# Patient Record
Sex: Male | Born: 1985 | Race: White | Hispanic: No | Marital: Single | Smoking: Former smoker
Health system: Southern US, Community
[De-identification: ages and names within clinical notes are randomized; demographics above are authoritative.]

## PROBLEM LIST (undated history)

## (undated) DIAGNOSIS — Z720 Tobacco use: Secondary | ICD-10-CM

## (undated) DIAGNOSIS — R55 Syncope and collapse: Secondary | ICD-10-CM

## (undated) DIAGNOSIS — E876 Hypokalemia: Secondary | ICD-10-CM

## (undated) DIAGNOSIS — F1290 Cannabis use, unspecified, uncomplicated: Secondary | ICD-10-CM

## (undated) HISTORY — DX: Hypokalemia: E87.6

## (undated) HISTORY — DX: Cannabis use, unspecified, uncomplicated: F12.90

## (undated) HISTORY — DX: Hypomagnesemia: E83.42

## (undated) HISTORY — DX: Syncope and collapse: R55

## (undated) HISTORY — DX: Tobacco use: Z72.0

---
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
## 2007-01-09 ENCOUNTER — Emergency Department: Payer: Self-pay | Admitting: Emergency Medicine

## 2007-01-10 ENCOUNTER — Emergency Department: Payer: Self-pay | Admitting: Emergency Medicine

## 2007-01-23 ENCOUNTER — Emergency Department: Payer: Self-pay | Admitting: Emergency Medicine

## 2011-07-23 ENCOUNTER — Emergency Department: Payer: Self-pay | Admitting: Emergency Medicine

## 2017-09-08 ENCOUNTER — Emergency Department: Payer: Medicaid Other

## 2017-09-08 ENCOUNTER — Emergency Department
Admission: EM | Admit: 2017-09-08 | Discharge: 2017-09-08 | Disposition: A | Discharge status: Home / Self Care | Payer: Medicaid Other | Attending: Emergency Medicine | Admitting: Emergency Medicine

## 2017-09-08 DIAGNOSIS — Y939 Activity, unspecified: Secondary | ICD-10-CM | POA: Insufficient documentation

## 2017-09-08 DIAGNOSIS — Y999 Unspecified external cause status: Secondary | ICD-10-CM | POA: Diagnosis not present

## 2017-09-08 DIAGNOSIS — F17200 Nicotine dependence, unspecified, uncomplicated: Secondary | ICD-10-CM | POA: Insufficient documentation

## 2017-09-08 DIAGNOSIS — S99922A Unspecified injury of left foot, initial encounter: Secondary | ICD-10-CM | POA: Diagnosis present

## 2017-09-08 DIAGNOSIS — W1789XA Other fall from one level to another, initial encounter: Secondary | ICD-10-CM | POA: Insufficient documentation

## 2017-09-08 DIAGNOSIS — Y929 Unspecified place or not applicable: Secondary | ICD-10-CM | POA: Diagnosis not present

## 2017-09-08 DIAGNOSIS — S8255XA Nondisplaced fracture of medial malleolus of left tibia, initial encounter for closed fracture: Secondary | ICD-10-CM | POA: Diagnosis not present

## 2017-09-08 MED ORDER — OXYCODONE-ACETAMINOPHEN 5-325 MG PO TABS: 1.0000 | ORAL_TABLET | Freq: Four times a day (QID) | ORAL | 0 refills | Status: AC | PRN

## 2017-09-08 MED ORDER — OXYCODONE-ACETAMINOPHEN 5-325 MG PO TABS
1.0000 | ORAL_TABLET | Freq: Once | ORAL | Status: AC
  Administered 2017-09-08: 1 via ORAL
  Filled 2017-09-08: qty 1

## 2017-09-08 NOTE — ED Notes (Signed)
He reports that he was working on top of a camper and he accidentally fell off - [id number] fall  Bilateral foot pain present

## 2017-09-08 NOTE — ED Triage Notes (Signed)
Pt reports was on top of camper and lost balance and fell, now c/o of pain in both feet.

## 2017-09-08 NOTE — ED Provider Notes (Signed)
[facility] Emergency Department Provider Note  ____________________________________________  Time seen: Approximately 7:23 PM  I have reviewed the triage vital signs and the nursing notes.   HISTORY  Chief Complaint Foot Pain    HPI Raymond Farmer is a 31 y.o. male presents to the emergency department with bilateral feet pain after falling from a camper.  Patient reports that camper height was approximately 10 feet.  Patient has a history of bilateral feet arthritis.  Patient is currently under the care of Dr. Alberteen Spindleline.  Patient did not hit his head or lose consciousness.  He denies weakness, radiculopathy or changes in sensation in the upper or lower extremities.  Patient has not been able to bear weight since the incident.  No skin compromise occurred.  No alleviating measures have been attempted prior to presenting to the emergency department.   History reviewed. No pertinent past medical history.  There are no active problems to display for this patient.   History reviewed. No pertinent surgical history.  Prior to Admission medications   Medication Sig Start Date End Date Taking? Authorizing Provider  oxyCODONE-acetaminophen (ROXICET) 5-325 MG tablet Take 1 tablet by mouth every 6 (six) hours as needed for up to 5 days for severe pain. 09/08/17 09/13/17  Orvil FeilWoods, Laronica Bhagat M, PA-C    Allergies Patient has no known allergies.  No family history on file.  Social History Social History   Tobacco Use  . Smoking status: Current Every Day Smoker  Substance Use Topics  . Alcohol use: No    Frequency: Never  . Drug use: No     Review of Systems  Constitutional: No fever/chills Eyes: No visual changes. No discharge ENT: No upper respiratory complaints. Cardiovascular: no chest pain. Respiratory: no cough. No SOB. Gastrointestinal: No abdominal pain.  No nausea, no vomiting.  No diarrhea.  No constipation. Musculoskeletal: Patient has bilateral feet  pain.  Skin: Negative for rash, abrasions, lacerations, ecchymosis. Neurological: Negative for headaches, focal weakness or numbness.   ____________________________________________   PHYSICAL EXAM:  VITAL SIGNS: ED Triage Vitals  Enc Vitals Group     BP 09/08/17 1539 117/69     Pulse Rate 09/08/17 1539 70     Resp 09/08/17 1539 18     Temp 09/08/17 1539 98.9 F (37.2 C)     Temp src --      SpO2 09/08/17 1539 99 %     Weight 09/08/17 1538 110 lb (49.9 kg)     Height 09/08/17 1538 5\' 4"  (1.626 m)     Head Circumference --      Peak Flow --      Pain Score 09/08/17 1646 7     Pain Loc --      Pain Edu? --      Excl. in GC? --      Constitutional: Alert and oriented. Well appearing and in no acute distress. Eyes: Conjunctivae are normal. PERRL. EOMI. Head: Atraumatic. Cardiovascular: Normal rate, regular rhythm. Normal S1 and S2.  Good peripheral circulation. Respiratory: Normal respiratory effort without tachypnea or retractions. Lungs CTAB. Good air entry to the bases with no decreased or absent breath sounds. Musculoskeletal: Clubfoot deformity visualized bilaterally.  Patient is able to perform limited range of motion of the feet bilaterally.  Patient has pain elicited with palpation over the left medial malleolus.  Palpable dorsalis pedis pulse bilaterally and symmetrically. Neurologic:  Normal speech and language. No gross focal neurologic deficits are appreciated.  Skin:  Skin  is warm, dry and intact. No rash noted. Psychiatric: Mood and affect are normal. Speech and behavior are normal. Patient exhibits appropriate insight and judgement.   ____________________________________________   LABS (all labs ordered are listed, but only abnormal results are displayed)  Labs Reviewed - No data to display ____________________________________________  EKG   ____________________________________________  RADIOLOGY Geraldo Pitter, personally viewed and evaluated  these images (plain radiographs) as part of my medical decision making, as well as reviewing the written report by the radiologist.  Ct Foot Left Wo Contrast  Result Date: 09/08/2017 CLINICAL DATA:  Left foot pain after fall off a roof. History of clubfoot repair. EXAM: CT OF THE LEFT FOOT WITHOUT CONTRAST TECHNIQUE: Multidetector CT imaging of the left foot was performed according to the standard protocol. Multiplanar CT image reconstructions were also generated. COMPARISON:  09/08/2017 and 01/09/2007 radiographs of the left foot. FINDINGS: Bones/Joint/Cartilage There are acute, closed fractures of the medial and posterior malleoli with tiny fracture fragment displaced posteriorly off the posterior malleolus similar to findings on same day radiographs. The medial malleolar fracture is nondisplaced in appearance. No joint dislocations. The subtalar joint is maintained without fracture. A curvilinear lucency is noted along the posterior third of the calcaneus surrounded by subtle sclerosis, image 35, series 605 without apparent cortical break. This appearance is more suggestive of a calcaneal stress fracture as the lucency crosses perpendicular to trabecular bone markings and given the presence of slight surrounding sclerosis. Mild dorsal widening across the talonavicular and naviculocuneiform articulations are chronic in consistent with prior clubfoot repair. Subcortical cystic change of the navicular bone is identified, degenerative in etiology. Pes cavus configuration of the midfoot. Ligaments Suboptimally assessed by CT. Muscles and Tendons No muscle atrophy or tendon entrapment Soft tissues No focal soft tissue hematoma. There is soft tissue swelling normal about the ankle joint more so medially. IMPRESSION: 1. Acute nondisplaced transverse fracture through the medial malleolus. 2. Acute posterior malleolar fracture with minimal displacement of a small sliver of bone posteriorly. 3. Subtle curvilinear  lucency crossing trabecular markings within the posterior calcaneus. There is slight sclerosis about the lucency which does not extend to the cortical surface. Findings or more in keeping with a pre-existing, concomitant calcaneal stress fracture. 4. Periarticular soft tissue swelling secondary to the fractures about the ankle. 5. Pes cavus deformity of the foot that may be secondary to prior clubfoot repair. 6. The tibiotalar and subtalar joints are maintained. Electronically Signed   By: Tollie Eth M.D.   On: 09/08/2017 18:45   Ct Foot Right Wo Contrast  Result Date: 09/08/2017 CLINICAL DATA:  Right heel pain after jumping off a roof. History of clubfoot repair. EXAM: CT OF THE RIGHT FOOT WITHOUT CONTRAST TECHNIQUE: Multidetector CT imaging of the right foot was performed according to the standard protocol. Multiplanar CT image reconstructions were also generated. COMPARISON:  Same day foot radiographs FINDINGS: Bones/Joint/Cartilage Pes cavus configuration of the midfoot. The bones are slightly demineralized in appearance. No acute displaced fracture. The tibiotalar and subtalar joints are congruent. Talonavicular osteoarthritis with subchondral cystic change of the navicular is noted. There is chondrocalcinosis of the cartilage within this joint, posterior subtalar facet and across the calcaneocuboid articulation. Subchondral degenerative cystic change of the lateral cuneiform is also noted. Mild dorsal widening across the talonavicular and naviculocuneiform articulations are likely secondary to chronic developmental or postop change for clubfoot repair. Lisfranc articulation appears intact. Ligaments Suboptimally assessed by CT. Muscles and Tendons Negative Soft tissues Subcutaneous soft tissue  induration along the plantar aspect of the hindfoot consistent with soft tissue contusion. No focal fluid collection to suggest hematoma IMPRESSION: 1. Pes cavus of the midfoot. 2. No acute foot fracture or joint  dislocation. 3. 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 4. Mild plantar soft tissue induration consistent with posttraumatic contusion. Electronically Signed   By: Tollie Eth M.D.   On: 09/08/2017 18:35   Dg Foot Complete Left  Result Date: 09/08/2017 CLINICAL DATA:  Left foot pain after jumping from roof and landing on feet. History of clubfoot repair. Posterior right heel pain. EXAM: LEFT FOOT - COMPLETE 3+ VIEW COMPARISON:  2008 foot radiographs FINDINGS: Minimally displaced coronal fracture fragment is seen posterior to the ankle joint suspicious for a posterior malleolar fracture. Incongruity of the posterior facet of the subtalar joint is also noted on the lateral view suspicious for a minimally displaced calcaneal fracture. Periarticular soft tissue swelling is noted about the ankle. There is chronic slight widening along the dorsal aspect of the naviculocuneiform and talonavicular joints with pes cavus configuration of the midfoot. Findings given chronicity likely represent post pop change. Reportedly, the patient has had prior foot surgeries for clubfoot deformity. IMPRESSION: 1. Incongruity of the posterior facet of the subtalar joint suspicious for a calcaneal fracture. 2. Small fracture fragment is seen posterior to the distal tibia suspicious for a posterior malleolar fracture. 3. There is pes cavus of the midfoot with slight dorsal widening across the talonavicular and naviculocuneiform articulations which appear chronic and stable. Reportedly the patient has had multiple prior surgeries for clubfoot deformity which this appearance may represent. Electronically Signed   By: Tollie Eth M.D.   On: 09/08/2017 16:24   Dg Foot Complete Right  Result Date: 09/08/2017 CLINICAL DATA:  Pain after jumping off roof and landing on feet. History of prior foot surgery for clubfoot repair. EXAM: RIGHT FOOT COMPLETE - 3+ VIEW COMPARISON:  Contralateral foot radiographs from the  same day. FINDINGS: Pes cavus deformity of the foot with dorsal widening of the talonavicular and naviculocuneiform articulations similar to the contralateral foot. Findings likely represent chronic changes post clubfoot surgery. No joint dislocation is identified across the tibiotalar nor subtalar joints. No acute appearing fracture is visualized. IMPRESSION: Chronic postop appearance the midfoot without acute displaced appearing fracture. If the patient has pain out of proportion to radiographic findings, CT may help for further correlation. Electronically Signed   By: Tollie Eth M.D.   On: 09/08/2017 16:30    ____________________________________________    PROCEDURES  Procedure(s) performed:    Procedures    Medications  oxyCODONE-acetaminophen (PERCOCET/ROXICET) 5-325 MG per tablet 1 tablet (1 tablet Oral Given 09/08/17 1917)     ____________________________________________   INITIAL IMPRESSION / ASSESSMENT AND PLAN / ED COURSE  Pertinent labs & imaging results that were available during my care of the patient were reviewed by me and considered in my medical decision making (see chart for details).  Review of the [place] CSRS was performed in accordance of the [facility] prior to dispensing any controlled drugs.    Assessment and plan Medial malleolus fracture, left.   Patient presents to the emergency department with bilateral feet pain after falling from a camper.  Initial x-rays conducted in the emergency department revealed a left medial malleolus fracture and possible calcaneus fracture.  Due to patient's history of feet osteoarthritis with associated deformity, CT was warranted bilaterally.  CT examination revealed a medial malleolus fracture and possible stress fracture of the  calcaneus that is likely chronic in nature.  Patient was splinted in the emergency department and advised to follow-up with podiatry, Dr. Alberteen Spindleline.  Vital signs were reassuring prior to discharge.  Patient was  discharged with Roxicet.  All patient questions were answered.     ____________________________________________  FINAL CLINICAL IMPRESSION(S) / ED DIAGNOSES  Final diagnoses:  Nondisplaced fracture of medial malleolus of left tibia, initial encounter for closed fracture      NEW MEDICATIONS STARTED DURING THIS VISIT:  ED Discharge Orders        Ordered    oxyCODONE-acetaminophen (ROXICET) 5-325 MG tablet  Every 6 hours PRN     09/08/17 1905          This chart was dictated using voice recognition software/Dragon. Despite best efforts to proofread, errors can occur which can change the meaning. Any change was purely unintentional.    Orvil FeilWoods, Punam Broussard M, PA-C 09/08/17 1934    Merrily Brittleifenbark, Neil, MD 09/08/17 2007

## 2017-11-17 ENCOUNTER — Encounter (HOSPITAL_COMMUNITY): Payer: Self-pay | Admitting: Emergency Medicine

## 2017-11-17 ENCOUNTER — Emergency Department (HOSPITAL_COMMUNITY)
Admission: EM | Admit: 2017-11-17 | Discharge: 2017-11-17 | Discharge status: Against Medical Advice | Payer: Medicaid Other | Attending: Emergency Medicine | Admitting: Emergency Medicine

## 2017-11-17 ENCOUNTER — Other Ambulatory Visit: Payer: Self-pay

## 2017-11-17 DIAGNOSIS — Z5321 Procedure and treatment not carried out due to patient leaving prior to being seen by health care provider: Secondary | ICD-10-CM | POA: Diagnosis not present

## 2017-11-17 DIAGNOSIS — R1031 Right lower quadrant pain: Secondary | ICD-10-CM | POA: Diagnosis not present

## 2017-11-17 LAB — BASIC METABOLIC PANEL
ANION GAP: 17 — AB (ref 5–15)
BUN: 16 mg/dL (ref 6–20)
CO2: 20 mmol/L — AB (ref 22–32)
Calcium: 9.7 mg/dL (ref 8.9–10.3)
Chloride: 103 mmol/L (ref 101–111)
Creatinine, Ser: 1.21 mg/dL (ref 0.61–1.24)
GFR calc Af Amer: >60 mL/min (ref >60)
GFR calc non Af Amer: >60 mL/min (ref >60)
GLUCOSE: 95 mg/dL (ref 65–99)
POTASSIUM: 3.9 mmol/L (ref 3.5–5.1)
Sodium: 140 mmol/L (ref 135–145)

## 2017-11-17 LAB — CBC
HEMATOCRIT: 44.6 % (ref 39.0–52.0)
HEMOGLOBIN: 15.3 g/dL (ref 13.0–17.0)
MCH: 30.1 pg (ref 26.0–34.0)
MCHC: 34.3 g/dL (ref 30.0–36.0)
MCV: 87.6 fL (ref 78.0–100.0)
Platelets: 202 10*3/uL (ref 150–400)
RBC: 5.09 MIL/uL (ref 4.22–5.81)
RDW: 13.7 % (ref 11.5–15.5)
WBC: 9.7 10*3/uL (ref 4.0–10.5)

## 2017-11-17 LAB — LIPASE, BLOOD: Lipase: 24 U/L (ref 11–51)

## 2017-11-17 NOTE — ED Notes (Signed)
Called patient for vitals recheck, no answer.

## 2017-11-17 NOTE — ED Notes (Signed)
No answer in waiting room 

## 2017-11-17 NOTE — ED Notes (Signed)
No answer for vitals recheck

## 2017-11-17 NOTE — ED Triage Notes (Signed)
Pt in via [facility] with c/o RLQ pain, n/v since yesterday. Per EMS, [facility]. EMS states pt had syncopal episode with unresponsiveness x 3 min. BP 108/74, CBG 122, given zofran 4mg  PTA

## 2017-11-18 ENCOUNTER — Other Ambulatory Visit: Payer: Self-pay

## 2017-11-18 ENCOUNTER — Emergency Department
Admission: EM | Admit: 2017-11-18 | Discharge: 2017-11-18 | Disposition: A | Discharge status: Home / Self Care | Payer: Medicaid Other | Attending: Emergency Medicine | Admitting: Emergency Medicine

## 2017-11-18 ENCOUNTER — Emergency Department: Payer: Medicaid Other

## 2017-11-18 ENCOUNTER — Encounter: Payer: Self-pay | Admitting: Emergency Medicine

## 2017-11-18 DIAGNOSIS — E876 Hypokalemia: Secondary | ICD-10-CM

## 2017-11-18 DIAGNOSIS — R531 Weakness: Secondary | ICD-10-CM | POA: Diagnosis not present

## 2017-11-18 DIAGNOSIS — R110 Nausea: Secondary | ICD-10-CM | POA: Diagnosis not present

## 2017-11-18 DIAGNOSIS — F17200 Nicotine dependence, unspecified, uncomplicated: Secondary | ICD-10-CM | POA: Diagnosis not present

## 2017-11-18 DIAGNOSIS — R4020 Unspecified coma: Secondary | ICD-10-CM

## 2017-11-18 DIAGNOSIS — R569 Unspecified convulsions: Secondary | ICD-10-CM | POA: Diagnosis present

## 2017-11-18 LAB — COMPREHENSIVE METABOLIC PANEL
ALT: 15 U/L — AB (ref 17–63)
AST: 22 U/L (ref 15–41)
Albumin: 3.4 g/dL — ABNORMAL LOW (ref 3.5–5.0)
Alkaline Phosphatase: 69 U/L (ref 38–126)
Anion gap: 7 (ref 5–15)
BILIRUBIN TOTAL: 0.4 mg/dL (ref 0.3–1.2)
BUN: 17 mg/dL (ref 6–20)
CHLORIDE: 112 mmol/L — AB (ref 101–111)
CO2: 21 mmol/L — ABNORMAL LOW (ref 22–32)
CREATININE: 0.89 mg/dL (ref 0.61–1.24)
Calcium: 6.9 mg/dL — ABNORMAL LOW (ref 8.9–10.3)
GFR calc non Af Amer: >60 mL/min (ref >60)
Glucose, Bld: 80 mg/dL (ref 65–99)
POTASSIUM: 2.7 mmol/L — AB (ref 3.5–5.1)
Sodium: 140 mmol/L (ref 135–145)
TOTAL PROTEIN: 5.7 g/dL — AB (ref 6.5–8.1)

## 2017-11-18 LAB — URINE DRUG SCREEN, QUALITATIVE (ARMC ONLY)
Amphetamines, Ur Screen: NOT DETECTED
BARBITURATES, UR SCREEN: NOT DETECTED
Benzodiazepine, Ur Scrn: NOT DETECTED
CANNABINOID 50 NG, UR ~~LOC~~: POSITIVE — AB
COCAINE METABOLITE, UR ~~LOC~~: NOT DETECTED
MDMA (Ecstasy)Ur Screen: NOT DETECTED
METHADONE SCREEN, URINE: NOT DETECTED
Opiate, Ur Screen: NOT DETECTED
Phencyclidine (PCP) Ur S: NOT DETECTED
Tricyclic, Ur Screen: NOT DETECTED

## 2017-11-18 LAB — CBC
HEMATOCRIT: 41 % (ref 40.0–52.0)
Hemoglobin: 13.6 g/dL (ref 13.0–18.0)
MCH: 29.2 pg (ref 26.0–34.0)
MCHC: 33.2 g/dL (ref 32.0–36.0)
MCV: 88.1 fL (ref 80.0–100.0)
PLATELETS: 185 10*3/uL (ref 150–440)
RBC: 4.65 MIL/uL (ref 4.40–5.90)
RDW: 13.4 % (ref 11.5–14.5)
WBC: 12.4 10*3/uL — ABNORMAL HIGH (ref 3.8–10.6)

## 2017-11-18 LAB — MAGNESIUM: MAGNESIUM: 1.4 mg/dL — AB (ref 1.7–2.4)

## 2017-11-18 MED ORDER — ONDANSETRON 4 MG PO TBDP: 4.0000 mg | ORAL_TABLET | Freq: Three times a day (TID) | ORAL | 0 refills | Status: DC | PRN

## 2017-11-18 MED ORDER — SODIUM CHLORIDE 0.9 % IV SOLN
1.0000 g | Freq: Once | INTRAVENOUS | Status: AC
  Administered 2017-11-18: 1 g via INTRAVENOUS
  Filled 2017-11-18: qty 10

## 2017-11-18 MED ORDER — SODIUM CHLORIDE 0.9 % IV SOLN
Freq: Once | INTRAVENOUS | Status: AC
  Administered 2017-11-18: 19:00:00 via INTRAVENOUS

## 2017-11-18 MED ORDER — POTASSIUM CHLORIDE 10 MEQ/100ML IV SOLN
10.0000 meq | Freq: Once | INTRAVENOUS | Status: AC
  Administered 2017-11-18: 10 meq via INTRAVENOUS
  Filled 2017-11-18: qty 100

## 2017-11-18 MED ORDER — POTASSIUM CHLORIDE CRYS ER 20 MEQ PO TBCR
40.0000 meq | EXTENDED_RELEASE_TABLET | Freq: Once | ORAL | Status: AC
  Administered 2017-11-18: 40 meq via ORAL
  Filled 2017-11-18: qty 2

## 2017-11-18 MED ORDER — MAGNESIUM SULFATE 2 GM/50ML IV SOLN
2.0000 g | Freq: Once | INTRAVENOUS | Status: AC
  Administered 2017-11-18: 2 g via INTRAVENOUS
  Filled 2017-11-18: qty 50

## 2017-11-18 MED ORDER — POTASSIUM CHLORIDE ER 20 MEQ PO TBCR: 20.0000 meq | EXTENDED_RELEASE_TABLET | Freq: Every day | ORAL | 0 refills | Status: DC

## 2017-11-18 MED ORDER — ONDANSETRON HCL 4 MG/2ML IJ SOLN
4.0000 mg | Freq: Once | INTRAMUSCULAR | Status: DC
  Filled 2017-11-18: qty 2

## 2017-11-18 MED ORDER — CALCIUM GLUCONATE 500 MG PO TABS: 1.0000 | ORAL_TABLET | Freq: Three times a day (TID) | ORAL | 0 refills | Status: DC

## 2017-11-18 NOTE — Discharge Instructions (Signed)
As I explained to you, at this time I am not sure if you are passing out or having seizures. Take zofran as needed for nausea. Drink 1 glass of orange juice and eat one banana a day until you are able to eat normally to avoid have low potassium again. It is important that you follow up with Neurology and cardiology for further evaluation. In the meantime DO NOT DRIVE or do any activities that could cause injury to yourself or others if you were to loose consciousness. If you have another episode, please return to the ER.   Seizures may happen at any time. It is important to take certain precautions to maintain your safety.   Follow up with your doctor in 1-3 days.  During a seizure, a person may injure himself or herself. Seizure precautions are guidelines that a person can follow in order to minimize injury during a seizure. For any activity, it is important to ask, "What would happen if I had a seizure while doing this?" Follow the below precautions.  Bathroom Safety  A person with seizures may want to shower instead of bathe to avoid accidental drowning. If falls occur during the patient's typical seizure, a person should use a shower seat, preferably one with a safety strap.  Use nonskid strips in your shower or tub.  Never use electrical equipment near water. This prevents accidental electrocution.  Consider changing glass in shower doors to shatterproof glass.  Secondary school teacherKitchen Safety If possible, cook when someone else is nearby.  Use the back burners of the stove to prevent accidental burns.  Use shatterproof containers as much as possible. For instance, sauces can be transferred from glass bottles to plastic containers for use.  Limit time that is required using knives or other sharp objects. If possible, buy foods that are already cut, or ask someone to help in meal preparation.   General Safety at Home Do not smoke or light fires in the fireplace unless someone else is present.  Do not use  space heaters that can be accidentally overturned.  When alone, avoid using step stools or ladders, and do not clean rooftop gutters.  Purchase power tools and motorized Risk managerlawn equipment which have a safety switch that will stop the machine if you release the handle (a 'dead man's' switch).   Driving and Transportation Avoid driving unless your seizures are well controlled and/or you have permission to drive from your state's Department of Motor Vehicles  [facility](DMV). Each state has different laws. Please refer to the following link on the [employer]ile[employer] of [employer]'s website for more information: http://www.epilepsyfoundation.org/answerplace/Social/driving/drivingu.cfm  If you ride a bicycle, wear a helmet and any other necessary protective gear.  When taking public transportation like the bus or subway, stay clear of the platform edge.   Outdoor Theatre managerand Sports Safety Swimming is okay, but does present certain risks. Never swim alone, and tell friends what to do if you have a seizure while swimming.  Wear appropriate protective equipment.  Ski with a friend. If a seizure occurs, your friend can seek help, if needed. He or she can also help to get you out of the cold. Consider using a safety hook or belt while riding the ski lift.

## 2017-11-18 NOTE — ED Provider Notes (Addendum)
[facility] Emergency Department Provider Note  ____________________________________________  Time seen: Approximately 4:46 PM  I have reviewed the triage vital signs and the nursing notes.   HISTORY  Chief Complaint Seizures   HPI Raymond Farmer is a 32 y.o. male with no significant past medical history who presents for evaluation of loss of consciousness. According to patient's wife he has had nausea and decreased appetite for 3 days. Yesterday he was feeling very weak and had an episode of LOC while sitting on the couch. As they were driving him to the emergency room patient had a second episode of LOC and they [place] [employer]. When patient regained consciousness he was combative and confused. [facility] was [facility] however due to the waiting time he left without being seen. Today he was sitting in the carport smoking a cigarette when his wife arrived from the supermarket. She put the groceries in the kitchen and when she walked out patient was  on the floor unconscious. She denies ever seeing any seizure-like activity and reports that patient becomes floppy. 911 was called. When the paramedics arrived patient was alert, combative, disoriented. No bowel or bladder loss, no tongue trauma, no generalized tonic-clonic activity. No prior history of seizure, no family history of seizure, syncope or sudden death. Patient is a smoker but denies alcohol use. He endorses that he smokes marijuana occasionally but no other drugs. Patient reports that he has no recollection of these episodes and has no warning. She denies headache, chest pain, palpitations, shortness of breath, abdominal pain, diarrhea, fever or chills.  History reviewed. No pertinent past medical history.  There are no active problems to display for this patient.   History reviewed. No pertinent surgical history.  Prior to Admission medications   Not on File    Allergies Patient has  no known allergies.  No family history on file.  Social History Social History   Tobacco Use  . Smoking status: Current Every Day Smoker  . Smokeless tobacco: Never Used  Substance Use Topics  . Alcohol use: No    Frequency: Never  . Drug use: Yes    Frequency: 2.0 times per week    Types: Marijuana    Review of Systems  Constitutional: Negative for fever. + LOC episodes Eyes: Negative for visual changes. ENT: Negative for sore throat. Neck: No neck pain  Cardiovascular: Negative for chest pain. Respiratory: Negative for shortness of breath. Gastrointestinal: Negative for abdominal pain, vomiting or diarrhea. Genitourinary: Negative for dysuria. Musculoskeletal: Negative for back pain. Skin: Negative for rash. Neurological: Negative for headaches, weakness or numbness. Psych: No SI or HI  ____________________________________________   PHYSICAL EXAM:  VITAL SIGNS: ED Triage Vitals  Enc Vitals Group     BP 11/18/17 1615 122/82     Pulse Rate 11/18/17 1615 (!) 51     Resp 11/18/17 1615 14     Temp 11/18/17 1615 98.4 F (36.9 C)     Temp Source 11/18/17 1615 Oral     SpO2 11/18/17 1551 99 %     Weight 11/18/17 1616 120 lb (54.4 kg)     Height 11/18/17 1616 5\' 2"  (1.575 m)     Head Circumference --      Peak Flow --      Pain Score --      Pain Loc --      Pain Edu? --      Excl. in GC? --  Constitutional: Alert and oriented. Well appearing and in no apparent distress. HEENT:      Head: Normocephalic and atraumatic.         Eyes: Conjunctivae are normal. Sclera is non-icteric.       Mouth/Throat: Mucous membranes are moist.       Neck: Supple with no signs of meningismus. Cardiovascular: Regular rate and rhythm. No murmurs, gallops, or rubs. 2+ symmetrical distal pulses are present in all extremities. No JVD. Respiratory: Normal respiratory effort. Lungs are clear to auscultation bilaterally. No wheezes, crackles, or rhonchi.  Gastrointestinal: Soft,  non tender, and non distended with positive bowel sounds. No rebound or guarding. Musculoskeletal: Nontender with normal range of motion in all extremities. No edema, cyanosis, or erythema of extremities. Neurologic: Normal speech and language. A & O x3, PERRL, EOMI, no nystagmus, CN II-XII intact, motor testing reveals good tone and bulk throughout. There is no evidence of pronator drift or dysmetria. Muscle strength is 5/5 throughout. Sensory examination is intact. Gait is normal. Skin: Skin is warm, dry and intact. No rash noted. Psychiatric: Mood and affect are normal. Speech and behavior are normal.  ____________________________________________   LABS (all labs ordered are listed, but only abnormal results are displayed)  Labs Reviewed  CBC - Abnormal; Notable for the following components:      Result Value   WBC 12.4 (*)    All other components within normal limits  COMPREHENSIVE METABOLIC PANEL - Abnormal; Notable for the following components:   Potassium 2.7 (*)    Chloride 112 (*)    CO2 21 (*)    Calcium 6.9 (*)    Total Protein 5.7 (*)    Albumin 3.4 (*)    ALT 15 (*)    All other components within normal limits  URINE DRUG SCREEN, QUALITATIVE ([facility] ONLY) - Abnormal; Notable for the following components:   Cannabinoid 50 Ng, Ur [place] POSITIVE (*)    All other components within normal limits  MAGNESIUM - Abnormal; Notable for the following components:   Magnesium 1.4 (*)    All other components within normal limits   ____________________________________________  EKG  ED ECG REPORT I, Nita Sickle, the attending physician, personally viewed and interpreted this ECG.  Sinus bradycardia, normal intervals, normal axis, no STE or depressions, no evidence of HOCM, AV block, delta wave, ARVD, prolonged QTc, WPW, or Brugada.   ____________________________________________  RADIOLOGY  Interpreted by me: CT head: Normal   Interpretation by Radiologist:  Ct Head Wo  Contrast  Result Date: 11/18/2017 CLINICAL DATA:  Pt arrives via [facility] s/p episode of "staring into space" and "going unconscious" per pt's wife (per EMS). When pt wakes up, he is confused and combative. Upon EMS arrival, pt combative initially. Pt reports going to [facility] yesterda.*comment was truncated* EXAM: CT HEAD WITHOUT CONTRAST TECHNIQUE: Contiguous axial images were obtained from the base of the skull through the vertex without intravenous contrast. COMPARISON:  None. FINDINGS: Brain: No acute intracranial hemorrhage. No focal mass lesion. No CT evidence of acute infarction. No midline shift or mass effect. No hydrocephalus. Basilar cisterns are patent. Vascular: No hyperdense vessel or unexpected calcification. Skull: Normal. Negative for fracture or focal lesion. Sinuses/Orbits: Paranasal sinuses and mastoid air cells are clear. Orbits are clear. Other: None. IMPRESSION: Normal head CT. Electronically Signed   By: Genevive Bi M.D.   On: 11/18/2017 17:00    ____________________________________________   PROCEDURES  Procedure(s) performed: None Procedures Critical Care performed:  None ____________________________________________  INITIAL IMPRESSION / ASSESSMENT AND PLAN / ED COURSE   32 y.o. male with no significant past medical history who presents for evaluation of loss of consciousness x 3 in the last 2 days. patient has no preceding warnings, no seizure-like activity, no urinary or bowel loss, no personal or family history of epilepsy, however he does seem postictal after these episodes where he becomes combative and agitated and has a memory gap. EKG with no evidence of ischemia or dysrhythmia. We'll check electrolytes, head CT, we'll monitor patient on cardiac telemetry case these episodes are arrhythmias. If workup is normal and patient remains stable in the ED plan to dc with f/u with cardiology and neurology.    _________________________ 8:14 PM on  11/18/2017 -----------------------------------------  Electrolytes are abnormal with hypokalemia hypomagnesemia in the setting of a decreased by mouth intake for the last 3 days due to nausea. Those electrolytes were replenished. EKG does not show any evidence of dysrhythmias. Patient was monitored on telemetry for several hours with no evidence of dysrhythmia. I personally reviewed patient's tele strip for the duration of his aty with no abnormalities. Remaining of his labs, drug screen, and head CT with no acute findings. At this time it is unclear if patient is having seizure episodes or syncopal events due to electrolyte abnormalities. I will refer him to cardiology and neurology for further evaluation. Discussed seizure precautions including not driving until cleared by neurology. Wife is also aware of these recommendations. Patient will be given Zofran and will be discharged home at this time.   As part of my medical decision making, I reviewed the following data within the electronic MEDICAL RECORD NUMBER History obtained from family, Nursing notes reviewed and incorporated, Labs reviewed , EKG interpreted , Old EKG reviewed, Radiograph reviewed , Notes from prior ED visits and [place] Controlled Substance Database    Pertinent labs & imaging results that were available during my care of the patient were reviewed by me and considered in my medical decision making (see chart for details).    ____________________________________________   FINAL CLINICAL IMPRESSION(S) / ED DIAGNOSES  Final diagnoses:  Loss of consciousness (HCC)  Nausea  Hypokalemia  Hypomagnesemia  Hypocalcemia      NEW MEDICATIONS STARTED DURING THIS VISIT:  ED Discharge Orders    None       Note:  This document was prepared using Dragon voice recognition software and may include unintentional dictation errors.    Nita Sickle, MD 11/18/17 2022    Nita Sickle, MD 11/18/17 2024

## 2017-11-18 NOTE — ED Notes (Signed)
Patient transported to CT 

## 2017-11-18 NOTE — ED Notes (Signed)
Pt ambulatory upon discharge. Verbalized understanding of discharge instructions and follow-up care. VSS. Skin warm and dry. A&O x4. Leaving with wife.

## 2017-11-18 NOTE — ED Triage Notes (Signed)
Pt arrives via [facility] s/p episode of "staring into space" and "going unconscious" per pt's wife (per EMS). When pt wakes up, he is confused and combative. Upon EMS arrival, pt combative initially. [facility] same [facility] but d/t the wait, they left and went home. Denies hx seizures. Denies pain.  Seizure pads applied to side rails by this RN.

## 2017-11-26 DIAGNOSIS — R404 Transient alteration of awareness: Secondary | ICD-10-CM | POA: Diagnosis not present

## 2017-12-04 ENCOUNTER — Ambulatory Visit: Payer: Medicaid Other | Admitting: Nurse Practitioner

## 2017-12-04 ENCOUNTER — Encounter: Payer: Self-pay | Admitting: Nurse Practitioner

## 2017-12-04 VITALS — BP 111/77 | HR 56 | Ht 65.0 in | Wt 114.5 lb

## 2017-12-04 DIAGNOSIS — Z720 Tobacco use: Secondary | ICD-10-CM | POA: Diagnosis not present

## 2017-12-04 DIAGNOSIS — E876 Hypokalemia: Secondary | ICD-10-CM | POA: Diagnosis not present

## 2017-12-04 DIAGNOSIS — F1210 Cannabis abuse, uncomplicated: Secondary | ICD-10-CM | POA: Diagnosis not present

## 2017-12-04 DIAGNOSIS — R55 Syncope and collapse: Secondary | ICD-10-CM

## 2017-12-04 DIAGNOSIS — R9431 Abnormal electrocardiogram [ECG] [EKG]: Secondary | ICD-10-CM | POA: Diagnosis not present

## 2017-12-04 NOTE — Patient Instructions (Signed)
Medication Instructions:  Your physician recommends that you continue on your current medications as directed. Please refer to the Current Medication list given to you today.   Labwork: Your physician recommends that you return for lab work in: TODAY (BMET, MG, TSH).   Testing/Procedures: Your physician has requested that you have an echocardiogram. Echocardiography is a painless test that uses sound waves to create images of your heart. It provides your doctor with information about the size and shape of your heart and how well your heart's chambers and valves are working. This procedure takes approximately one hour. There are no restrictions for this procedure.  Your physician has recommended that you wear an 30-DAY [employer] event monitor. Event monitors are medical devices that record the heart's electrical activity. Doctors most often [place] these monitors to diagnose arrhythmias. Arrhythmias are problems with the speed or rhythm of the heartbeat. The monitor is a small, portable device. You can wear one while you do your normal daily activities. This is usually used to diagnose what is causing palpitations/syncope (passing out).  - You will be mailed a monitor from [employer].  - They will call you in the next day or so to verify your address. Then is will take 5-7 days to be mailed to you. - You will wear for 30 days and then place all the pieces of equipment that came with the device back in the provided box and take it to your nearest UPS drop off locations. - Call [employer] at [phone], if you have any questions concerning the monitor once you have received it. - DO NOT GET THE MONITOR WET.     Follow-Up: Your physician recommends that you schedule a follow-up appointment WEEK OF April 8TH WITH DR Graciela Husbands.   Echocardiogram An echocardiogram, or echocardiography, uses sound waves (ultrasound) to produce an image of your heart. The echocardiogram is simple, painless, obtained  within a short period of time, and offers valuable information to your health care provider. The images from an echocardiogram can provide information such as:  Evidence of coronary artery disease (CAD).  Heart size.  Heart muscle function.  Heart valve function.  Aneurysm detection.  Evidence of a past heart attack.  Fluid buildup around the heart.  Heart muscle thickening.  Assess heart valve function.  Tell a health care provider about:  Any allergies you have.  All medicines you are taking, including vitamins, herbs, eye drops, creams, and over-the-counter medicines.  Any problems you or family members have had with anesthetic medicines.  Any blood disorders you have.  Any surgeries you have had.  Any medical conditions you have.  Whether you are pregnant or may be pregnant. What happens before the procedure? No special preparation is needed. Eat and drink normally. What happens during the procedure?  In order to produce an image of your heart, gel will be applied to your chest and a wand-like tool (transducer) will be moved over your chest. The gel will help transmit the sound waves from the transducer. The sound waves will harmlessly bounce off your heart to allow the heart images to be captured in real-time motion. These images will then be recorded.  You may need an IV to receive a medicine that improves the quality of the pictures. What happens after the procedure? You may return to your normal schedule including diet, activities, and medicines, unless your health care provider tells you otherwise. This information is not intended to replace advice given to you by your health care provider.  Make sure you discuss any questions you have with your health care provider. Document Released: 09/28/2000 Document Revised: 05/19/2016 Document Reviewed: 06/08/2013 Elsevier Interactive Patient Education  2017 [employer]

## 2017-12-04 NOTE — Progress Notes (Signed)
Cardiology Clinic Note   Patient Name: BRIAN ZEITLIN Date of Encounter: 12/04/2017  Primary Care Provider:  Patient, No Pcp Per Primary Cardiologist:  New - will f/u with Odessa Fleming, MD   Patient Profile    32 y/o ? with a history of tobacco and marijuana use who was recently evaluated in the emergency department for syncope and abnormal ECG, who presents to establish cardiology care.  Past Medical History    Past Medical History:  Diagnosis Date  . Hypokalemia    a. 11/2017 K 2.7 in ED.  Marland Kitchen Hypomagnesemia    a. 11/2017 Mg 1.4 in ED.  . Marijuana use   . Syncope   . Tobacco abuse    History reviewed. No pertinent surgical history.  Allergies  No Known Allergies  History of Present Illness    32 year old male without prior cardiac history.  He lives locally with his wife and 2 children.  He has been smoking cigarettes since the age of 49-1 pack/day.  He also smokes marijuana daily.  He does not routinely exercise.    He has no significant family history of premature CAD or sudden death.  He was in his usual state of health until February 1 or 2, when he noted somewhat upset stomach with nausea and poor appetite.  In that setting, he did not eat anything all day on February 2 or on the morning of February 3.  That morning, he became progressively more nauseated and had multiple episodes of vomiting.  He denies experiencing diarrhea or fevers but his wife noted that he was sweating quite a bit and seemed very weak and pale.  She helped him into the car and began driving towards [facility] when he suddenly lost consciousness in the car.  She says he became limp and his head fell forward onto the dashboard.  At that point, instead of driving to [place], she drove to a fire station which was near their house.  She says he was in and out of consciousness for about 10 minutes while at the fire station and prior to EMTs arriving.  As far as she was aware, he was breathing and had a  pulse, and did not require any CPR or advanced therapies.  Patient has no recollection of this.  Once EMTs arrived, he was sitting up and feeling better.  He was taken to [facility] emergency department where he says he waited in the waiting room for 3 hours and so he left.  Labs were drawn prior to him leaving and with the exception of a creatinine of 1.21, labs were unremarkable.  He went home that evening and continued to feel weak with a poor appetite.  He did not eat or drink anything.  The next morning, he felt a little bit better.  He went with his wife to [place] and upon returning home, he was smoking a cigarette in the car port when his wife went inside to put some bags down.  When she came back outside, he was lying on the driveway and was unresponsive.  He was intermittently arousable.  EMS was called and he remained in and out of consciousness after their arrival.  He was taken to the [facility] emergency department where he was found to be hypokalemic with a potassium of 2.7 and hypomagnesemic with magnesium of 1.4.  Heart rate was 51 with a blood pressure of 122/82.  He was afebrile.  Tox screen was positive for marijuana.  He  was treated with IV fluids, potassium, magnesium, and zofran with symptomatic improvement.  No events were noted on telemetry.  ECG was notable for LVH, biatrial enlargement, and J-point/ST segment elevation in anterior leads in the setting of electrolyte abnormalities).  He was subsequently discharged from the emergency department with recommendation of follow-up with neurology and cardiology.  He is scheduled for an EEG in the coming weeks.  Following ER discharge, his appetite remained poor for a few more days but then did recover and he has since been eating pretty normally.  He has not had any more episodes of loss of consciousness but his wife says for a few days after the ER visit, she would intermittently find him staring blankly and he would not immediately respond to  verbal stimuli.  This could go on for up to a few minutes.   Over the past week, he has been stable without any presyncope or recurrence of staring spells.  He denies any prior history of chest pain, dyspnea, palpitations, PND, orthopnea, edema, or early satiety.  He says he has not changed where he gets his supply of marijuana from and he does not think that that is related to this.  Home Medications    Prior to Admission medications   None    Family History    Family History  Problem Relation Age of Onset  . Other Mother        alive and well in her early 67's  . Diabetes Father        alive and well in his early 7's  . Hypertension Maternal Grandmother   . Other Sister        alive and well.   indicated that his mother is alive. He indicated that his father is alive. He indicated that his sister is alive. He indicated that his maternal grandmother is alive.  Social History    Social History   Socioeconomic History  . Marital status: Single    Spouse name: Not on file  . Number of children: Not on file  . Years of education: Not on file  . Highest education level: Not on file  Social Needs  . Financial resource strain: Not on file  . Food insecurity - worry: Not on file  . Food insecurity - inability: Not on file  . Transportation needs - medical: Not on file  . Transportation needs - non-medical: Not on file  Occupational History  . Not on file  Tobacco Use  . Smoking status: Current Every Day Smoker    Packs/day: 1.00    Years: 15.00    Pack years: 15.00  . Smokeless tobacco: Never Used  Substance and Sexual Activity  . Alcohol use: No    Frequency: Never  . Drug use: Yes    Types: Marijuana    Comment: smokes daily  . Sexual activity: Yes    Partners: Female  Other Topics Concern  . Not on file  Social History Narrative   Lives locally with wife and their two children (ages 2 and 3).  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     Review of Systems      General:  No chills, fever, night sweats or weight changes.  Cardiovascular:  No chest pain, dyspnea on exertion, edema, orthopnea, palpitations, paroxysmal nocturnal dyspnea. +++ presyncope/syncope/wkns Dermatological: No rash, lesions/masses Respiratory: No cough, dyspnea Urologic: No hematuria, dysuria Abdominal:   No nausea, vomiting, diarrhea, bright red blood per rectum, melena, or  hematemesis Neurologic:  +++ periods of absence - staring off, unresponsive per wife.  No visual changes, +++ wkns, +++ changes in mental status. All other systems reviewed and are otherwise negative except as noted above.  Physical Exam    VS:  BP 111/77 (BP Location: Right Arm, Patient Position: Sitting, Cuff Size: Normal)   Pulse (!) 56   Ht 5\' 5"  (1.651 m)   Wt 114 lb 8 oz (51.9 kg)   BMI 19.05 kg/m  , BMI Body mass index is 19.05 kg/m. GEN: Thin, in no acute distress. Multiple tattoos. HEENT: normal.  Neck: Supple, no JVD, carotid bruits, or masses. Cardiac: RRR, no murmurs, rubs, or gallops. No clubbing, cyanosis, edema.  Radials/DP/PT 2+ and equal bilaterally.  Respiratory:  Respirations regular and unlabored, clear to auscultation bilaterally. GI: Soft, nontender, nondistended, BS + x 4. MS: no deformity or atrophy. Skin: warm and dry, no rash. Neuro:  Strength and sensation are intact. Psych: Normal affect.  Accessory Clinical Findings    ECG -dated 11/17/2017 from the [facility] emergency department: Sinus arrhythmia, 66, biatrial enlargement, borderline LVH, 1 mm J-point and ST segment elevation in lead V1 which is somewhat coving, with 1 mm more flat ST segment elevation in lead V2 and upsloping ST elevation in lead V3.  ECG-dated today, December 04, 2017: Sinus bradycardia, 56, LVH, early repolarization.  Assessment & Plan   1.  Syncope/abnormal ECG: Patient recently presented to the [facility] emergency department following approximately 48 hours of no appetite or p.o. intake, which  was followed by several hours of what is described as fairly significant vomiting, weakness, and then loss of consciousness.  His ECG in the emergency department that evening was notable for anterior J-point and ST elevation as well as biatrial enlargement.  Of note, labs were relatively unremarkable with the exception of a creatinine of 1.21.  Patient left the emergency department as he got tired of waiting in the emergency room.  He had recurrent presyncope, syncope, weakness with what is described as being in and out of consciousness, even while being attended to by EMTs the following day.  He was taken to the [facility] ED and was hemodynamically stable.  There is not a repeat ECG from that day (the one scanned into the computer is actually dated February 3 when you open up the attachment).  However, labs were found to be significantly abnormal with a potassium of 2.7 and magnesium of 1.4.  He had no significant arrhythmias and ECG was reviewed from the prior day and was not felt to represent Brugada's.  He was discharged home.  His appetite returned a few days later though in the interim, his wife did occasionally catch him staring off blankly and not immediately responding despite verbal stimuli.  He has had no recurrence of complete loss of consciousness like what was previously described.  He denies any history of chest pain or dyspnea.  He has no family history of premature coronary disease or sudden death.  I suspect his symptoms were largely driven by gastroenteritis with vomiting, dehydration, and electrolyte abnormalities.  Especially in light of being monitored by EMS during the episodes on February 4.  His ECG today is more suggestive of LVH and early repolarization.  No murmurs on exam.  He is not hypertensive.  I will arrange for repeat basic metabolic panel, magnesium, and also a TSH as well as an echocardiogram and 30-day event monitor.  2.  Tobacco marijuana abuse: Complete cessation advised.  He  is not considering quitting at this time.  3.  Hypokalemia and hypomagnesemia: This occurred in the setting of anorexia and significant vomiting.  Follow-up labs today.  4.  Follow-up labs, monitoring, and echo as outlined above.  Follow-up with Dr. Graciela Husbands in approximately 6 weeks once the monitor is off.  Nicolasa Ducking, NP 12/04/2017, 12:41 PM

## 2017-12-05 LAB — BASIC METABOLIC PANEL
BUN/Creatinine Ratio: 11 (ref 9–20)
BUN: 11 mg/dL (ref 6–20)
CALCIUM: 9.5 mg/dL (ref 8.7–10.2)
CHLORIDE: 104 mmol/L (ref 96–106)
CO2: 24 mmol/L (ref 20–29)
Creatinine, Ser: 1.01 mg/dL (ref 0.76–1.27)
GFR calc non Af Amer: 99 mL/min/{1.73_m2} (ref >59)
GFR, EST AFRICAN AMERICAN: 115 mL/min/{1.73_m2} (ref >59)
Glucose: 87 mg/dL (ref 65–99)
POTASSIUM: 5 mmol/L (ref 3.5–5.2)
Sodium: 143 mmol/L (ref 134–144)

## 2017-12-05 LAB — MAGNESIUM: Magnesium: 1.9 mg/dL (ref 1.6–2.3)

## 2017-12-05 LAB — TSH: TSH: 2.01 u[IU]/mL (ref 0.450–4.500)

## 2017-12-10 ENCOUNTER — Encounter: Payer: Self-pay | Admitting: *Deleted

## 2017-12-19 ENCOUNTER — Other Ambulatory Visit: Payer: Medicaid Other

## 2017-12-30 ENCOUNTER — Telehealth: Payer: Self-pay | Admitting: Internal Medicine

## 2017-12-30 DIAGNOSIS — R55 Syncope and collapse: Secondary | ICD-10-CM

## 2017-12-30 DIAGNOSIS — R9431 Abnormal electrocardiogram [ECG] [EKG]: Secondary | ICD-10-CM

## 2017-12-30 NOTE — Telephone Encounter (Signed)
I attempted to call the patient to follow up about his work situation. His voice mail is full and I am unable to leave a message.  Will call back.

## 2017-12-30 NOTE — Telephone Encounter (Signed)
Patient came by Will need a signed work release States at last visit he was cleared verbally but did not receive a work release to take Please call when ready

## 2017-12-31 NOTE — Telephone Encounter (Signed)
To Dr. Graciela HusbandsKlein to review.  He is scheduled to see Dr. Graciela HusbandsKlein on 01/30/18. Was supposed to wear an event monitor- in reviewing [employer] website, it looks like the patient's monitor was cancelled.

## 2017-12-31 NOTE — Telephone Encounter (Signed)
I spoke with Dr. Graciela HusbandsKlein regarding the patient's request for a RTW note.  Per Dr. Graciela HusbandsKlein, the patient will need an echo with a bubble study due to his syncopal episode and the fact that his EKG is also abnormal.  He can see the patient here in [place] on Thursday morning, but would like the echo w/ bubble study prior. Spoke with Rene KocherRegina in echo scheduling at [facility] she has added the patient on to the echo schedule for 8:30 am tomorrow.  I have called the patient and made him aware of Dr. Graciela HusbandsKlein recommendations prior to giving him a back to work note.  The patient is agreeable. He is aware that his echo w/ bubble study is scheduled for tomorrow morning in [place]- made patient aware of location of the office- at 8:30 am (advised her be there at 8:15 am to check in). He is also agreeable with seeing Dr. Graciela HusbandsKlein here in the [place] office on Thursday morning at 11:15 am.   The patient verbalized understanding of all of the above.  He is aware that once his test is done and his follow up with Dr. Graciela HusbandsKlein, then a decision can be made about him returning to work.

## 2017-12-31 NOTE — Telephone Encounter (Signed)
Patient came to office to pick up letter for work.    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    Please call cell phone     [phone]

## 2018-01-01 ENCOUNTER — Ambulatory Visit (HOSPITAL_COMMUNITY): Payer: Medicaid Other | Attending: Cardiology

## 2018-01-01 ENCOUNTER — Other Ambulatory Visit: Payer: Self-pay

## 2018-01-01 DIAGNOSIS — R9431 Abnormal electrocardiogram [ECG] [EKG]: Secondary | ICD-10-CM

## 2018-01-01 DIAGNOSIS — R55 Syncope and collapse: Secondary | ICD-10-CM

## 2018-01-02 ENCOUNTER — Encounter: Payer: Self-pay | Admitting: *Deleted

## 2018-01-02 ENCOUNTER — Ambulatory Visit: Payer: Medicaid Other | Admitting: Internal Medicine

## 2018-01-02 ENCOUNTER — Encounter: Payer: Self-pay | Admitting: Internal Medicine

## 2018-01-02 VITALS — BP 100/66 | HR 62 | Ht 65.0 in | Wt 112.0 lb

## 2018-01-02 DIAGNOSIS — R55 Syncope and collapse: Secondary | ICD-10-CM

## 2018-01-02 DIAGNOSIS — R9431 Abnormal electrocardiogram [ECG] [EKG]: Secondary | ICD-10-CM

## 2018-01-02 NOTE — Progress Notes (Signed)
ELECTROPHYSIOLOGY CONSULT NOTE  Patient ID: Raymond Farmer, MRN: [id number], DOB/AGE: 13-Sep-1986 32 y.o. Admit date: (Not on file) Date of Consult: 01/02/2018  Primary Physician: Patient, No Pcp Per Primary Cardiologist: TG     Raymond Farmer is a 32 y.o. male who is being seen today for the evaluation of syncope at the request of TG.    HPI Raymond Farmer is a 32 y.o. male  Seen at [facility] in [facility] hospital following syncopal episodes in early February.  These episodes both occurred in the context of a very stressful family interaction where the mother of his children left.  She describes his response to stress as not eating and drinking.  This was going on for about 48 hours prior to his first syncopal episode.  This occurred while they were standing out by his truck in between cigarettes.  He had been dry heaving for the last couple of days.  She notes that his response to stress is to stop eating and drinking, although he would continue to smoke and use marijuana.  He was transported by EMS.  The record was noted that on their arrival he was combative but vital signs are normal  They said in the emergency room for a couple of hours and then left.  He had recurrent syncope the next day this time with a little bit of a prodrome that he recalls.  His worth noting that he is unable to give me history hardly at all as to what happened.  It comes from his significant other.  He was described as pale.  He recalls being flushed and diaphoretic.  On arrival at [facility], his potassium was 2.7.  He was volume resuscitated.  Orthostatic vital signs were not taken or so not recorded  He has had no prior syncope and no subsequent syncope.  He has no palpitations.  He has no exercise intolerance.    ECG was abnormal with a RSR prime in the right axis.  Echocardiogram demonstrated normal LV function and the bubble study was negative for ASD.       Past Medical History:  Diagnosis  Date  . Hypokalemia    a. 11/2017 K 2.7 in ED.  Marland Kitchen Hypomagnesemia    a. 11/2017 Mg 1.4 in ED.  . Marijuana use   . Syncope   . Tobacco abuse       Surgical History: History reviewed. No pertinent surgical history.   Home Meds: Prior to Admission medications   Not on File    Allergies: No Known Allergies  Social History   Socioeconomic History  . Marital status: Single    Spouse name: Not on file  . Number of children: Not on file  . Years of education: Not on file  . Highest education level: Not on file  Occupational History  . Not on file  Social Needs  . Financial resource strain: Not on file  . Food insecurity:    Worry: Not on file    Inability: Not on file  . Transportation needs:    Medical: Not on file    Non-medical: Not on file  Tobacco Use  . Smoking status: Current Every Day Smoker    Packs/day: 1.00    Years: 15.00    Pack years: 15.00  . Smokeless tobacco: Never Used  Substance and Sexual Activity  . Alcohol use: No    Frequency: Never  . Drug use: Yes    Types:  Marijuana    Comment: smokes daily  . Sexual activity: Yes    Partners: Female  Lifestyle  . Physical activity:    Days per week: Not on file    Minutes per session: Not on file  . Stress: Not on file  Relationships  . Social connections:    Talks on phone: Not on file    Gets together: Not on file    Attends religious service: Not on file    Active member of club or organization: Not on file    Attends meetings of clubs or organizations: Not on file    Relationship status: Not on file  . Intimate partner violence:    Fear of current or ex partner: Not on file    Emotionally abused: Not on file    Physically abused: Not on file    Forced sexual activity: Not on file  Other Topics Concern  . Not on file  Social History Narrative   Lives locally with wife and their two children (ages 2 and 3).  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     Family History  Problem Relation Age of  Onset  . Other Mother        alive and well in her early 6850's  . Diabetes Father        alive and well in his early 2050's  . Hypertension Maternal Grandmother   . Other Sister        alive and well.     ROS:  Please see the history of present illness.     All other systems reviewed and negative.    Physical Exam: Blood pressure 100/66, pulse 62, height 5\' 5"  (1.651 m), weight 112 lb (50.8 kg). General: Well developed, well nourished male in no acute distress. Head: Normocephalic, atraumatic, sclera non-icteric, no xanthomas, nares are without discharge. EENT: normal   Lymph Nodes:  none Neck: Negative for carotid bruits. JVD not elevated. Back:without scoliosis kyphosis  Lungs: Clear bilaterally to auscultation without wheezes, rales, or rhonchi. Breathing is unlabored. Heart: RRR with S1 S2. No  murmur . No rubs, or gallops appreciated. Abdomen: Soft, non-tender, non-distended with normoactive bowel sounds. No hepatomegaly. No rebound/guarding. No obvious abdominal masses. Msk:  Strength and tone appear normal for age. Extremities: No clubbing or cyanosis. No edema.  Distal pedal pulses are 2+ and equal bilaterally. Skin: Warm and Dry Neuro: Alert and oriented X 3. CN III-XII intact Grossly normal sensory and motor function . Psych:  Responds to questions appropriately with a normal affect.      Labs: Cardiac Enzymes No results for input(s): CKTOTAL, CKMB, TROPONINI in the last 72 hours. CBC Lab Results  Component Value Date   WBC 12.4 (H) 11/18/2017   HGB 13.6 11/18/2017   HCT 41.0 11/18/2017   MCV 88.1 11/18/2017   PLT 185 11/18/2017   PROTIME: No results for input(s): LABPROT, INR in the last 72 hours. Chemistry No results for input(s): NA, K, CL, CO2, BUN, CREATININE, CALCIUM, PROT, BILITOT, ALKPHOS, ALT, AST, GLUCOSE in the last 168 hours.  Invalid input(s): LABALBU Lipids No results found for: CHOL, HDL, LDLCALC, TRIG BNP No results found for: PROBNP Thyroid  Function Tests: No results for input(s): TSH, T4TOTAL, T3FREE, THYROIDAB in the last 72 hours.  Invalid input(s): FREET3 Miscellaneous No results found for: DDIMER  Radiology/Studies:  No results found.  EKG: Sinus rhythm with an RSR prime in the right axis as noted above Personally reviewed  Assessment and Plan:  Syncope probably neurally mediated  Abnormal ECG without evidence of structural heart disease on echo  Cigarette use  Marijuana use    The patient had syncope in the context of significant psychosocial stress decreased oral intake dry heaving and secondary hypokalemia.  Associated epiphenomena are consistent with a neurally mediated reflex.  There is no evidence of structural heart disease to suggest a more malignant process.  We discussed extensively the issues of dysautonomia, the physiology of orthstasis and positional stress.  We discussed the role of salt and water repletion, the importance of exercise, often needing to be started in the recumbent position, and the awareness of triggers and the role of ambient heat and dehydration  I also stressed the importance of marijuana as a trigger  Is now been more than 6 weeks.  He had no interval events.  He may return to work without restriction.  I also encouraged him to stop smoking and I suggested the use of patches        Sherryl Manges

## 2018-01-02 NOTE — Patient Instructions (Addendum)
Medication Instructions: - Your physician recommends that you continue on your current medications as directed. Please refer to the Current Medication list given to you today.  Labwork: - none ordered  Procedures/Testing: - none ordered  Follow-Up: - Dr. Klein will see you back on an as needed basis.  Any Additional Special Instructions Will Be Listed Below (If Applicable).     If you need a refill on your cardiac medications before your next appointment, please call your pharmacy.   

## 2018-01-30 ENCOUNTER — Ambulatory Visit: Payer: Medicaid Other | Admitting: Internal Medicine

## 2020-03-05 ENCOUNTER — Emergency Department
Admission: EM | Admit: 2020-03-05 | Discharge: 2020-03-05 | Disposition: A | Discharge status: Home / Self Care | Payer: Medicaid Other | Attending: Emergency Medicine | Admitting: Emergency Medicine

## 2020-03-05 ENCOUNTER — Encounter: Payer: Self-pay | Admitting: Emergency Medicine

## 2020-03-05 ENCOUNTER — Other Ambulatory Visit: Payer: Self-pay

## 2020-03-05 ENCOUNTER — Emergency Department: Payer: Medicaid Other

## 2020-03-05 DIAGNOSIS — S39011A Strain of muscle, fascia and tendon of abdomen, initial encounter: Secondary | ICD-10-CM | POA: Diagnosis not present

## 2020-03-05 DIAGNOSIS — I10 Essential (primary) hypertension: Secondary | ICD-10-CM | POA: Diagnosis not present

## 2020-03-05 DIAGNOSIS — R071 Chest pain on breathing: Secondary | ICD-10-CM | POA: Diagnosis not present

## 2020-03-05 DIAGNOSIS — Y92812 Truck as the place of occurrence of the external cause: Secondary | ICD-10-CM | POA: Diagnosis not present

## 2020-03-05 DIAGNOSIS — F1210 Cannabis abuse, uncomplicated: Secondary | ICD-10-CM | POA: Diagnosis not present

## 2020-03-05 DIAGNOSIS — R1012 Left upper quadrant pain: Secondary | ICD-10-CM | POA: Insufficient documentation

## 2020-03-05 DIAGNOSIS — S39012A Strain of muscle, fascia and tendon of lower back, initial encounter: Secondary | ICD-10-CM | POA: Insufficient documentation

## 2020-03-05 DIAGNOSIS — X509XXA Other and unspecified overexertion or strenuous movements or postures, initial encounter: Secondary | ICD-10-CM | POA: Diagnosis not present

## 2020-03-05 DIAGNOSIS — F17210 Nicotine dependence, cigarettes, uncomplicated: Secondary | ICD-10-CM | POA: Insufficient documentation

## 2020-03-05 DIAGNOSIS — Y9389 Activity, other specified: Secondary | ICD-10-CM | POA: Diagnosis not present

## 2020-03-05 DIAGNOSIS — F17290 Nicotine dependence, other tobacco product, uncomplicated: Secondary | ICD-10-CM | POA: Insufficient documentation

## 2020-03-05 DIAGNOSIS — R079 Chest pain, unspecified: Secondary | ICD-10-CM | POA: Diagnosis not present

## 2020-03-05 DIAGNOSIS — Y998 Other external cause status: Secondary | ICD-10-CM | POA: Diagnosis not present

## 2020-03-05 DIAGNOSIS — S3991XA Unspecified injury of abdomen, initial encounter: Secondary | ICD-10-CM | POA: Diagnosis present

## 2020-03-05 LAB — URINALYSIS, COMPLETE (UACMP) WITH MICROSCOPIC
Bacteria, UA: NONE SEEN
Bilirubin Urine: NEGATIVE
Glucose, UA: NEGATIVE mg/dL
Hgb urine dipstick: NEGATIVE
Ketones, ur: NEGATIVE mg/dL
Leukocytes,Ua: NEGATIVE
Nitrite: NEGATIVE
Protein, ur: NEGATIVE mg/dL
Specific Gravity, Urine: 1.015 (ref 1.005–1.030)
Squamous Epithelial / LPF: NONE SEEN (ref 0–5)
pH: 7 (ref 5.0–8.0)

## 2020-03-05 LAB — COMPREHENSIVE METABOLIC PANEL
ALT: 14 U/L (ref 0–44)
AST: 17 U/L (ref 15–41)
Albumin: 4.5 g/dL (ref 3.5–5.0)
Alkaline Phosphatase: 93 U/L (ref 38–126)
Anion gap: 7 (ref 5–15)
BUN: 19 mg/dL (ref 6–20)
CO2: 26 mmol/L (ref 22–32)
Calcium: 9.2 mg/dL (ref 8.9–10.3)
Chloride: 102 mmol/L (ref 98–111)
Creatinine, Ser: 0.99 mg/dL (ref 0.61–1.24)
GFR calc Af Amer: >60 mL/min (ref >60)
GFR calc non Af Amer: >60 mL/min (ref >60)
Glucose, Bld: 96 mg/dL (ref 70–99)
Potassium: 4.1 mmol/L (ref 3.5–5.1)
Sodium: 135 mmol/L (ref 135–145)
Total Bilirubin: 1.1 mg/dL (ref 0.3–1.2)
Total Protein: 7.7 g/dL (ref 6.5–8.1)

## 2020-03-05 LAB — CBC
HCT: 42.4 % (ref 39.0–52.0)
Hemoglobin: 14.3 g/dL (ref 13.0–17.0)
MCH: 28.8 pg (ref 26.0–34.0)
MCHC: 33.7 g/dL (ref 30.0–36.0)
MCV: 85.3 fL (ref 80.0–100.0)
Platelets: 180 10*3/uL (ref 150–400)
RBC: 4.97 MIL/uL (ref 4.22–5.81)
RDW: 13.2 % (ref 11.5–15.5)
WBC: 8.5 10*3/uL (ref 4.0–10.5)
nRBC: 0 % (ref 0.0–0.2)

## 2020-03-05 LAB — LIPASE, BLOOD: Lipase: 27 U/L (ref 11–51)

## 2020-03-05 MED ORDER — KETOROLAC TROMETHAMINE 30 MG/ML IJ SOLN
15.0000 mg | INTRAMUSCULAR | Status: AC
  Administered 2020-03-05: 15 mg via INTRAVENOUS
  Filled 2020-03-05: qty 1

## 2020-03-05 MED ORDER — KETOROLAC TROMETHAMINE 10 MG PO TABS: 10.0000 mg | ORAL_TABLET | Freq: Four times a day (QID) | ORAL | 0 refills | Status: AC | PRN

## 2020-03-05 NOTE — ED Notes (Signed)
Pt visualized in NAD at this time, pt continues to rest in bed with NAD noted. SO remains at bedside. Pt given urinal by this RN and asked to provide a urine sample.

## 2020-03-05 NOTE — ED Provider Notes (Signed)
[facility] Emergency Department Provider Note  ____________________________________________  Time seen: Approximately 11:32 AM  I have reviewed the triage vital signs and the nursing notes.   HISTORY  Chief Complaint Abdominal Pain    HPI Raymond Farmer is a 34 y.o. male with no significant PMH who complains of L flank pain radiating around to LUQ.  hurts with taking a deep breath.  No chest pain or shortness of breath.  Onset this morning when getting up.  He notes that yesterday at work he was getting up and down from his truck a lot which requires a high step up.  No falls or trauma.  No lower extremity weakness or paresthesia.  No constipation diarrhea or vomiting.  Normal oral intake.  No fevers or chills.  With no sick who comes the ED complaining of left flank pain radiating around to left upper quadrant, worse with movement and twisting.      Past Medical History:  Diagnosis Date  . Hypokalemia    a. 11/2017 K 2.7 in ED.  Marland Kitchen Hypomagnesemia    a. 11/2017 Mg 1.4 in ED.  . Marijuana use   . Syncope   . Tobacco abuse      There are no problems to display for this patient.    History reviewed. No pertinent surgical history.   Prior to Admission medications   Medication Sig Start Date End Date Taking? Authorizing Provider  ketorolac (TORADOL) 10 MG tablet Take 1 tablet (10 mg total) by mouth every 6 (six) hours as needed for moderate pain. 03/05/20   Carrie Mew, MD     Allergies Patient has no known allergies.   Family History  Problem Relation Age of Onset  . Other Mother        alive and well in her early 34's  . Diabetes Father        alive and well in his early 10's  . Hypertension Maternal Grandmother   . Other Sister        alive and well.    Social History Social History   Tobacco Use  . Smoking status: Current Every Day Smoker    Packs/day: 1.00    Years: 15.00    Pack years: 15.00    Types: E-cigarettes  .  Smokeless tobacco: Never Used  Substance Use Topics  . Alcohol use: No  . Drug use: Yes    Types: Marijuana    Comment: smokes daily    Review of Systems  Constitutional:   No fever or chills.  ENT:   No sore throat. No rhinorrhea. Cardiovascular:   No chest pain or syncope. Respiratory:   No dyspnea or cough. Gastrointestinal:   Negative for abdominal pain, vomiting and diarrhea.  Musculoskeletal:   Positive left flank pain as above All other systems reviewed and are negative except as documented above in ROS and HPI.  ____________________________________________   PHYSICAL EXAM:  VITAL SIGNS: ED Triage Vitals  Enc Vitals Group     BP 03/05/20 0937 122/79     Pulse Rate 03/05/20 0937 (!) 48     Resp 03/05/20 0937 12     Temp 03/05/20 0937 98 F (36.7 C)     Temp Source 03/05/20 0937 Oral     SpO2 03/05/20 0937 100 %     Weight 03/05/20 0928 120 lb (54.4 kg)     Height 03/05/20 0928 5\' 2"  (1.575 m)     Head Circumference --  Peak Flow --      Pain Score 03/05/20 0928 5     Pain Loc --      Pain Edu? --      Excl. in GC? --     Vital signs reviewed, nursing assessments reviewed.   Constitutional:   Alert and oriented. Non-toxic appearance. Eyes:   Conjunctivae are normal. EOMI. PERRL. ENT      Head:   Normocephalic and atraumatic.      Nose:   Wearing a mask.      Mouth/Throat:   Wearing a mask.      Neck:   No meningismus. Full ROM. Hematological/Lymphatic/Immunilogical:   No cervical lymphadenopathy. Cardiovascular:   RRR. Symmetric bilateral radial and DP pulses.  No murmurs. Cap refill less than 2 seconds. Respiratory:   Normal respiratory effort without tachypnea/retractions. Breath sounds are clear and equal bilaterally. No wheezes/rales/rhonchi. Gastrointestinal:   Soft and nontender. Non distended. There is no CVA tenderness.  No rebound, rigidity, or guarding.  Musculoskeletal:   Normal range of motion in all extremities. No joint effusions.  No  lower extremity tenderness.  No edema. There is tenderness of the left mid back in the musculature reproducing his symptoms.  This reproducible tenderness exists along the oblique abdominal wall muscles as well. Neurologic:   Normal speech and language.  Motor grossly intact. No acute focal neurologic deficits are appreciated.  Skin:    Skin is warm, dry and intact. No rash noted.  No petechiae, purpura, or bullae.  ____________________________________________    LABS (pertinent positives/negatives) (all labs ordered are listed, but only abnormal results are displayed) Labs Reviewed  LIPASE, BLOOD  COMPREHENSIVE METABOLIC PANEL  CBC  URINALYSIS, COMPLETE (UACMP) WITH MICROSCOPIC   ____________________________________________   EKG    ____________________________________________    RADIOLOGY  DG Chest Portable 1 View  Result Date: 03/05/2020 CLINICAL DATA:  Left-sided chest pain EXAM: PORTABLE CHEST 1 VIEW COMPARISON:  None. FINDINGS: The heart size and mediastinal contours are within normal limits. Both lungs are clear. The visualized skeletal structures are unremarkable. IMPRESSION: No active disease. Electronically Signed   By: Alcide Clever M.D.   On: 03/05/2020 10:45    ____________________________________________   PROCEDURES Procedures  ____________________________________________    CLINICAL IMPRESSION / ASSESSMENT AND PLAN / ED COURSE  Medications ordered in the ED: Medications  ketorolac (TORADOL) 30 MG/ML injection 15 mg (15 mg Intravenous Given 03/05/20 0954)    Pertinent labs & imaging results that were available during my care of the patient were reviewed by me and considered in my medical decision making (see chart for details).  Raymond Farmer was evaluated in Emergency Department on 03/05/2020 for the symptoms described in the history of present illness. He was evaluated in the context of the global COVID-19 pandemic, which necessitated consideration  that the patient might be at risk for infection with the SARS-CoV-2 virus that causes COVID-19. Institutional protocols and algorithms that pertain to the evaluation of patients at risk for COVID-19 are in a state of rapid change based on information released by regulatory bodies including the CDC and federal and state organizations. These policies and algorithms were followed during the patient's care in the ED.   Patient presents with left flank pain, history and exam consistent with musculoskeletal strain in the mid back and abdominal wall.  Vital signs are unremarkable.  She is nontoxic, calm and comfortable.  Pleuritic component likely due to diaphragm mobilization and rib expansion.   Considering the patient's  symptoms, medical history, and physical examination today, I have low suspicion for ACS, PE, TAD, pneumothorax, carditis, mediastinitis, pneumonia, CHF, or sepsis.  Chest x-ray unremarkable, labs unremarkable.  Stable for discharge home, NSAIDs for pain control, heat therapy, follow-up with primary care.      ____________________________________________   FINAL CLINICAL IMPRESSION(S) / ED DIAGNOSES    Final diagnoses:  Abdominal wall strain, initial encounter  Back strain, initial encounter     ED Discharge Orders         Ordered    ketorolac (TORADOL) 10 MG tablet  Every 6 hours PRN     03/05/20 1132          Portions of this note were generated with dragon dictation software. Dictation errors may occur despite best attempts at proofreading.   Sharman Cheek, MD 03/05/20 1136

## 2020-03-05 NOTE — ED Notes (Signed)
Pt visualized in NAD at this time, resting in bed with SO at bedside playing on phone, call bell within reach, pain re-assessed. Pt denies further needs at this time.

## 2020-03-05 NOTE — ED Notes (Signed)
Pt c/o LUQ abd pain that radiates to L flank that started this morning, pt denies pain with palpation to LUQ, states some pain with palpation to L flank, pt denies N/V/D, states last BM this AM and was normal.

## 2020-03-05 NOTE — ED Notes (Signed)
NAD noted at time of D/C. Pt denies questions or concerns. Pt ambulatory to the lobby at this time.  

## 2020-03-05 NOTE — ED Triage Notes (Signed)
Pt checked in at first nurse desk.  Verbal permission given from patient to ask questions at first nurse desk for triage.     Pt having LUQ pain that radiates to flank area.  No history of kidney stones. No vomiting or fever.  Unlabored.

## 2020-03-05 NOTE — ED Notes (Signed)
Urine sample collected by this RN and sent to lab.

## 2020-03-05 NOTE — Discharge Instructions (Addendum)
Your labs and chest xray were all normal today.

## 2020-03-21 ENCOUNTER — Emergency Department
Admission: EM | Admit: 2020-03-21 | Discharge: 2020-03-21 | Discharge status: Against Medical Advice | Payer: Medicaid Other

## 2020-03-21 DIAGNOSIS — R1084 Generalized abdominal pain: Secondary | ICD-10-CM | POA: Diagnosis not present

## 2020-03-21 DIAGNOSIS — R112 Nausea with vomiting, unspecified: Secondary | ICD-10-CM | POA: Diagnosis not present

## 2020-06-16 ENCOUNTER — Encounter: Payer: Self-pay | Admitting: Emergency Medicine

## 2020-06-16 ENCOUNTER — Other Ambulatory Visit: Payer: Self-pay

## 2020-06-16 ENCOUNTER — Emergency Department
Admission: EM | Admit: 2020-06-16 | Discharge: 2020-06-16 | Disposition: A | Discharge status: Home / Self Care | Payer: Medicaid Other | Attending: Emergency Medicine | Admitting: Emergency Medicine

## 2020-06-16 DIAGNOSIS — Z5321 Procedure and treatment not carried out due to patient leaving prior to being seen by health care provider: Secondary | ICD-10-CM | POA: Insufficient documentation

## 2020-06-16 DIAGNOSIS — R1032 Left lower quadrant pain: Secondary | ICD-10-CM | POA: Insufficient documentation

## 2020-06-16 DIAGNOSIS — R112 Nausea with vomiting, unspecified: Secondary | ICD-10-CM | POA: Diagnosis not present

## 2020-06-16 LAB — CBC WITH DIFFERENTIAL/PLATELET
Abs Immature Granulocytes: 0.06 10*3/uL (ref 0.00–0.07)
Basophils Absolute: 0 10*3/uL (ref 0.0–0.1)
Basophils Relative: 0 %
Eosinophils Absolute: 0 10*3/uL (ref 0.0–0.5)
Eosinophils Relative: 0 %
HCT: 40.8 % (ref 39.0–52.0)
Hemoglobin: 13.9 g/dL (ref 13.0–17.0)
Immature Granulocytes: 1 %
Lymphocytes Relative: 3 %
Lymphs Abs: 0.3 10*3/uL — ABNORMAL LOW (ref 0.7–4.0)
MCH: 29.1 pg (ref 26.0–34.0)
MCHC: 34.1 g/dL (ref 30.0–36.0)
MCV: 85.5 fL (ref 80.0–100.0)
Monocytes Absolute: 1.3 10*3/uL — ABNORMAL HIGH (ref 0.1–1.0)
Monocytes Relative: 12 %
Neutro Abs: 9 10*3/uL — ABNORMAL HIGH (ref 1.7–7.7)
Neutrophils Relative %: 84 %
Platelets: 178 10*3/uL (ref 150–400)
RBC: 4.77 MIL/uL (ref 4.22–5.81)
RDW: 12.7 % (ref 11.5–15.5)
WBC: 10.8 10*3/uL — ABNORMAL HIGH (ref 4.0–10.5)
nRBC: 0 % (ref 0.0–0.2)

## 2020-06-16 LAB — COMPREHENSIVE METABOLIC PANEL
ALT: 31 U/L (ref 0–44)
AST: 21 U/L (ref 15–41)
Albumin: 4.7 g/dL (ref 3.5–5.0)
Alkaline Phosphatase: 104 U/L (ref 38–126)
Anion gap: 12 (ref 5–15)
BUN: 20 mg/dL (ref 6–20)
CO2: 26 mmol/L (ref 22–32)
Calcium: 9.5 mg/dL (ref 8.9–10.3)
Chloride: 102 mmol/L (ref 98–111)
Creatinine, Ser: 1.3 mg/dL — ABNORMAL HIGH (ref 0.61–1.24)
GFR calc Af Amer: >60 mL/min (ref >60)
GFR calc non Af Amer: >60 mL/min (ref >60)
Glucose, Bld: 138 mg/dL — ABNORMAL HIGH (ref 70–99)
Potassium: 3.5 mmol/L (ref 3.5–5.1)
Sodium: 140 mmol/L (ref 135–145)
Total Bilirubin: 1 mg/dL (ref 0.3–1.2)
Total Protein: 7.8 g/dL (ref 6.5–8.1)

## 2020-06-16 LAB — LIPASE, BLOOD: Lipase: 26 U/L (ref 11–51)

## 2020-06-16 MED ORDER — ONDANSETRON 4 MG PO TBDP
4.0000 mg | ORAL_TABLET | Freq: Once | ORAL | Status: AC
  Administered 2020-06-16: 07:00:00 4 mg via ORAL
  Filled 2020-06-16: qty 1

## 2020-06-16 NOTE — ED Notes (Signed)
Patient left to go and see PCP at 0755.

## 2020-06-16 NOTE — ED Triage Notes (Signed)
Pt to triage via w/c with no distress noted; reports left lower abd pain accomp by N/V since 3am

## 2021-02-28 ENCOUNTER — Encounter: Payer: Self-pay | Admitting: Emergency Medicine

## 2021-02-28 ENCOUNTER — Other Ambulatory Visit: Payer: Self-pay

## 2021-02-28 ENCOUNTER — Emergency Department
Admission: EM | Admit: 2021-02-28 | Discharge: 2021-02-28 | Disposition: A | Discharge status: Home / Self Care | Payer: Medicaid Other | Attending: Emergency Medicine | Admitting: Emergency Medicine

## 2021-02-28 DIAGNOSIS — R9431 Abnormal electrocardiogram [ECG] [EKG]: Secondary | ICD-10-CM | POA: Diagnosis not present

## 2021-02-28 DIAGNOSIS — F17290 Nicotine dependence, other tobacco product, uncomplicated: Secondary | ICD-10-CM | POA: Insufficient documentation

## 2021-02-28 DIAGNOSIS — R112 Nausea with vomiting, unspecified: Secondary | ICD-10-CM

## 2021-02-28 DIAGNOSIS — F12188 Cannabis abuse with other cannabis-induced disorder: Secondary | ICD-10-CM | POA: Insufficient documentation

## 2021-02-28 DIAGNOSIS — F1290 Cannabis use, unspecified, uncomplicated: Secondary | ICD-10-CM

## 2021-02-28 LAB — COMPREHENSIVE METABOLIC PANEL
ALT: 30 U/L (ref 0–44)
AST: 33 U/L (ref 15–41)
Albumin: 5.1 g/dL — ABNORMAL HIGH (ref 3.5–5.0)
Alkaline Phosphatase: 94 U/L (ref 38–126)
Anion gap: 13 (ref 5–15)
BUN: 23 mg/dL — ABNORMAL HIGH (ref 6–20)
CO2: 23 mmol/L (ref 22–32)
Calcium: 9.8 mg/dL (ref 8.9–10.3)
Chloride: 102 mmol/L (ref 98–111)
Creatinine, Ser: 1.24 mg/dL (ref 0.61–1.24)
GFR, Estimated: >60 mL/min (ref >60)
Glucose, Bld: 174 mg/dL — ABNORMAL HIGH (ref 70–99)
Potassium: 4 mmol/L (ref 3.5–5.1)
Sodium: 138 mmol/L (ref 135–145)
Total Bilirubin: 1.4 mg/dL — ABNORMAL HIGH (ref 0.3–1.2)
Total Protein: 8.5 g/dL — ABNORMAL HIGH (ref 6.5–8.1)

## 2021-02-28 LAB — CBC WITH DIFFERENTIAL/PLATELET
Abs Immature Granulocytes: 0.16 10*3/uL — ABNORMAL HIGH (ref 0.00–0.07)
Basophils Absolute: 0.1 10*3/uL (ref 0.0–0.1)
Basophils Relative: 0 %
Eosinophils Absolute: 0 10*3/uL (ref 0.0–0.5)
Eosinophils Relative: 0 %
HCT: 46.2 % (ref 39.0–52.0)
Hemoglobin: 15.8 g/dL (ref 13.0–17.0)
Immature Granulocytes: 1 %
Lymphocytes Relative: 2 %
Lymphs Abs: 0.6 10*3/uL — ABNORMAL LOW (ref 0.7–4.0)
MCH: 29 pg (ref 26.0–34.0)
MCHC: 34.2 g/dL (ref 30.0–36.0)
MCV: 84.8 fL (ref 80.0–100.0)
Monocytes Absolute: 1.6 10*3/uL — ABNORMAL HIGH (ref 0.1–1.0)
Monocytes Relative: 6 %
Neutro Abs: 25.9 10*3/uL — ABNORMAL HIGH (ref 1.7–7.7)
Neutrophils Relative %: 91 %
Platelets: 229 10*3/uL (ref 150–400)
RBC: 5.45 MIL/uL (ref 4.22–5.81)
RDW: 12.5 % (ref 11.5–15.5)
Smear Review: NORMAL
WBC: 28.3 10*3/uL — ABNORMAL HIGH (ref 4.0–10.5)
nRBC: 0 % (ref 0.0–0.2)

## 2021-02-28 LAB — MAGNESIUM: Magnesium: 1.6 mg/dL — ABNORMAL LOW (ref 1.7–2.4)

## 2021-02-28 LAB — LIPASE, BLOOD: Lipase: 27 U/L (ref 11–51)

## 2021-02-28 MED ORDER — LACTATED RINGERS IV BOLUS
1000.0000 mL | Freq: Once | INTRAVENOUS | Status: AC
  Administered 2021-02-28: 1000 mL via INTRAVENOUS

## 2021-02-28 MED ORDER — MAGNESIUM OXIDE -MG SUPPLEMENT 400 (240 MG) MG PO TABS
800.0000 mg | ORAL_TABLET | Freq: Once | ORAL | Status: AC
  Administered 2021-02-28: 800 mg via ORAL
  Filled 2021-02-28: qty 2

## 2021-02-28 MED ORDER — ONDANSETRON 4 MG PO TBDP: 4.0000 mg | ORAL_TABLET | Freq: Three times a day (TID) | ORAL | 0 refills | Status: DC | PRN

## 2021-02-28 MED ORDER — MAGNESIUM SULFATE 2 GM/50ML IV SOLN: 2.0000 g | Freq: Once | INTRAVENOUS | Status: DC

## 2021-02-28 MED ORDER — DROPERIDOL 2.5 MG/ML IJ SOLN
2.5000 mg | Freq: Once | INTRAMUSCULAR | Status: AC
  Administered 2021-02-28: 2.5 mg via INTRAVENOUS
  Filled 2021-02-28: qty 2

## 2021-02-28 NOTE — ED Notes (Signed)
EKG given to [facility] MD

## 2021-02-28 NOTE — ED Triage Notes (Signed)
Pt lying on floor in lobby; pt positions self into w/c upon instructions; pt is wretching loudly into a large trashbag but no emesis noted; reports generalized abd pain x 5 hrs; denies hx of same

## 2021-02-28 NOTE — ED Notes (Addendum)
Pt states he's ready to leave. He states he feels better now and denies pain. Pt informed that his magnesium level is low and a dose of magnesium is ordered but he states he wants to just leave. Jessup MD made aware

## 2021-02-28 NOTE — ED Provider Notes (Signed)
[facility] Emergency Department Provider Note   ____________________________________________   Event Date/Time   First MD Initiated Contact with Patient 02/28/21 [phone]     (approximate)  I have reviewed the triage vital signs and the nursing notes.   HISTORY  Chief Complaint Abdominal Pain    HPI Raymond Farmer is a 35 y.o. male who presents to the ED complaining of nausea and vomiting.  Majority of history is obtained from patient's wife due to his frequent retching.  She states that he developed nausea with near continuous retching and vomiting around 11 PM yesterday evening.  He has complained of pain in his upper abdomen since then, has additionally had a few episodes of diarrhea.  Wife states that their daughter also developed some vomiting and diarrhea around the same time, although her symptoms seem to improve while her husband's have been ongoing.  He has had similar symptoms in the past, admits to regular delta 8 cannabinoid usage, including earlier today.  He denies any fevers, cough, chest pain, or shortness of breath.        Past Medical History:  Diagnosis Date  . Hypokalemia    a. 11/2017 K 2.7 in ED.  Marland Kitchen Hypomagnesemia    a. 11/2017 Mg 1.4 in ED.  . Marijuana use   . Syncope   . Tobacco abuse     There are no problems to display for this patient.   History reviewed. No pertinent surgical history.  Prior to Admission medications   Medication Sig Start Date End Date Taking? Authorizing Provider  ondansetron (ZOFRAN ODT) 4 MG disintegrating tablet Take 1 tablet (4 mg total) by mouth every 8 (eight) hours as needed for nausea or vomiting. 02/28/21  Yes Chesley Noon, MD  ketorolac (TORADOL) 10 MG tablet Take 1 tablet (10 mg total) by mouth every 6 (six) hours as needed for moderate pain. 03/05/20   Sharman Cheek, MD    Allergies Patient has no known allergies.  Family History  Problem Relation Age of Onset  . Other Mother         alive and well in her early 72's  . Diabetes Father        alive and well in his early 77's  . Hypertension Maternal Grandmother   . Other Sister        alive and well.    Social History Social History   Tobacco Use  . Smoking status: Current Every Day Smoker    Packs/day: 1.00    Years: 15.00    Pack years: 15.00    Types: E-cigarettes  . Smokeless tobacco: Never Used  Vaping Use  . Vaping Use: Every day  Substance Use Topics  . Alcohol use: No  . Drug use: Yes    Types: Marijuana    Comment: smokes daily    Review of Systems  Constitutional: No fever/chills Eyes: No visual changes. ENT: No sore throat. Cardiovascular: Denies chest pain. Respiratory: Denies shortness of breath. Gastrointestinal: Positive for abdominal pain, nausea, and vomiting.  No diarrhea.  No constipation. Genitourinary: Negative for dysuria. Musculoskeletal: Negative for back pain. Skin: Negative for rash. Neurological: Negative for headaches, focal weakness or numbness.  ____________________________________________   PHYSICAL EXAM:  VITAL SIGNS: ED Triage Vitals  Enc Vitals Group     BP 02/28/21 0318 94/75     Pulse Rate 02/28/21 0318 97     Resp 02/28/21 0318 20     Temp 02/28/21 0318 97.7 F (36.5  C)     Temp Source 02/28/21 0318 Oral     SpO2 02/28/21 0318 99 %     Weight 02/28/21 0316 112 lb (50.8 kg)     Height 02/28/21 0316 5\' 2"  (1.575 m)     Head Circumference --      Peak Flow --      Pain Score 02/28/21 0316 10     Pain Loc --      Pain Edu? --      Excl. in GC? --     Constitutional: Alert and oriented.  Frequently retching. Eyes: Conjunctivae are normal. Head: Atraumatic. Nose: No congestion/rhinnorhea. Mouth/Throat: Mucous membranes are moist. Neck: Normal ROM Cardiovascular: Normal rate, regular rhythm. Grossly normal heart sounds. Respiratory: Normal respiratory effort.  No retractions. Lungs CTAB. Gastrointestinal: Soft and nontender. No  distention. Genitourinary: deferred Musculoskeletal: No lower extremity tenderness nor edema. Neurologic:  Normal speech and language. No gross focal neurologic deficits are appreciated. Skin:  Skin is warm, dry and intact. No rash noted. Psychiatric: Mood and affect are normal. Speech and behavior are normal.  ____________________________________________   LABS (all labs ordered are listed, but only abnormal results are displayed)  Labs Reviewed  CBC WITH DIFFERENTIAL/PLATELET - Abnormal; Notable for the following components:      Result Value   WBC 28.3 (*)    Neutro Abs 25.9 (*)    Lymphs Abs 0.6 (*)    Monocytes Absolute 1.6 (*)    Abs Immature Granulocytes 0.16 (*)    All other components within normal limits  COMPREHENSIVE METABOLIC PANEL - Abnormal; Notable for the following components:   Glucose, Bld 174 (*)    BUN 23 (*)    Total Protein 8.5 (*)    Albumin 5.1 (*)    Total Bilirubin 1.4 (*)    All other components within normal limits  MAGNESIUM - Abnormal; Notable for the following components:   Magnesium 1.6 (*)    All other components within normal limits  LIPASE, BLOOD  URINALYSIS, COMPLETE (UACMP) WITH MICROSCOPIC  URINE DRUG SCREEN, QUALITATIVE ([facility] ONLY)   ____________________________________________  EKG  ED ECG REPORT I, 03/02/21, the attending physician, personally viewed and interpreted this ECG.   Date: 02/28/2021  EKG Time: 3:56  Rate: 99  Rhythm: normal sinus rhythm  Axis: Normal  Intervals:none  ST&T Change: None   PROCEDURES  Procedure(s) performed (including Critical Care):  Procedures   ____________________________________________   INITIAL IMPRESSION / ASSESSMENT AND PLAN / ED COURSE       35 year old male with past medical history of marijuana use and electrolyte abnormalities who presents to the ED complaining of nausea and vomiting along with upper abdominal pain since 11 PM yesterday evening.  Patient is  frequently retching but has no abdominal tenderness on exam.  Symptoms are concerning for cannabinoid hyperemesis syndrome versus gastroenteritis, low suspicion for biliary pathology given lack of tenderness on exam.  EKG shows QTC within normal limits and we will treat with IV droperidol.  We will hydrate with IV fluids and check labs for any electrolyte abnormality.  Labs remarkable for leukocytosis and hypomagnesemia.  Patient states that he feels better following droperidol and is requesting to be discharged home, does not want to stay for IV magnesium supplementation or CT scan.  He is agreeable to stay for oral potassium supplementation, after which he will be discharged home with prescription for Zofran.  He was counseled to follow-up with PCP and to return to the ED for  new or worsening symptoms, also counseled to avoid cannabinoids as these could be contributing to his symptoms.  Patient agrees with plan.        ____________________________________________   FINAL CLINICAL IMPRESSION(S) / ED DIAGNOSES  Final diagnoses:  Nausea vomiting and diarrhea  Cannabinoid hyperemesis syndrome     ED Discharge Orders         Ordered    ondansetron (ZOFRAN ODT) 4 MG disintegrating tablet  Every 8 hours PRN        02/28/21 0520           Note:  This document was prepared using Dragon voice recognition software and may include unintentional dictation errors.   Chesley Noon, MD 02/28/21 [phone]

## 2021-02-28 NOTE — ED Notes (Signed)
Pt presenting to ED with abd pain and vomiting. Pt's wife at bedside states he began vomiting since around 10pm last night. Pt retching into trash bag but no emesis noted while this RN at bedside. Pt unable to lie supine d/t pain

## 2022-03-14 IMAGING — DX DG CHEST 1V PORT
1 series · 1 of 1 positions shown · non-contrast
Comparison: None.

CLINICAL DATA: Left-sided chest pain

EXAM:
PORTABLE CHEST 1 VIEW

[chest ap]
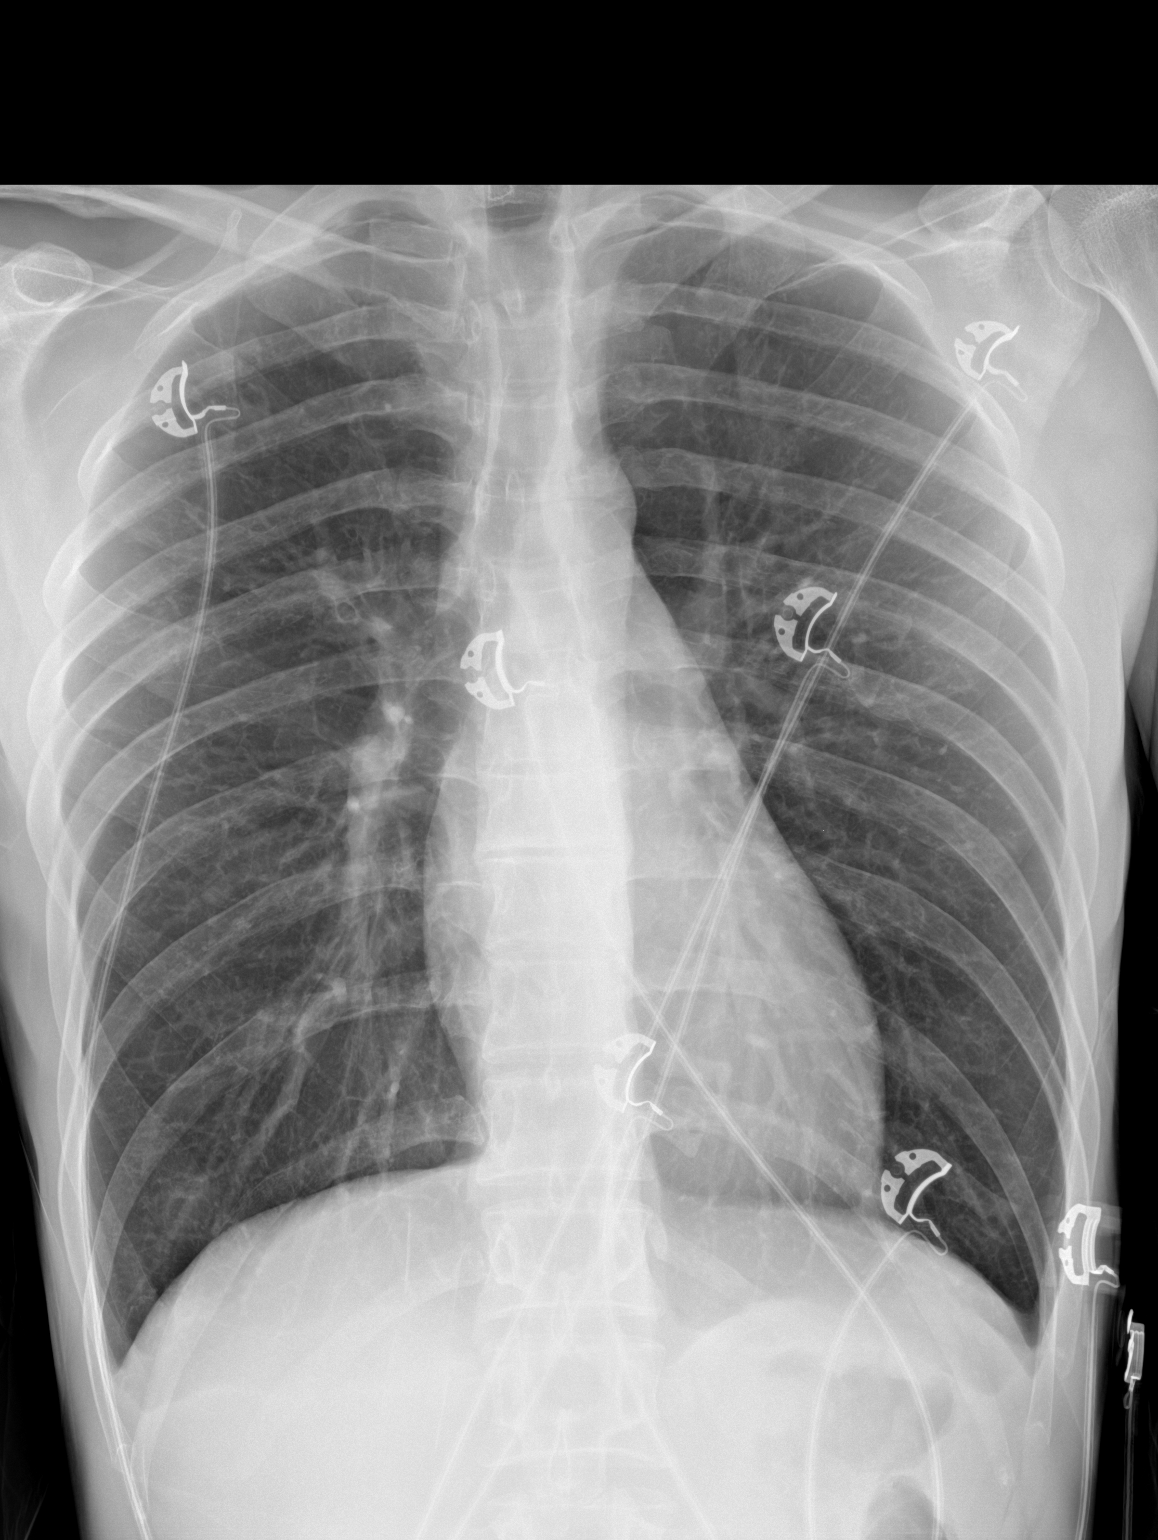

[1 of 1 positions shown; findings below may reference images not displayed]

FINDINGS: The heart size and mediastinal contours are within normal limits.
Both lungs are clear. The visualized skeletal structures are
unremarkable.
IMPRESSION: No active disease.

## 2022-05-18 ENCOUNTER — Emergency Department
Admission: EM | Admit: 2022-05-18 | Discharge: 2022-05-18 | Disposition: A | Discharge status: Home / Self Care | Payer: Medicaid Other | Attending: Emergency Medicine | Admitting: Emergency Medicine

## 2022-05-18 ENCOUNTER — Encounter: Payer: Self-pay | Admitting: Emergency Medicine

## 2022-05-18 DIAGNOSIS — E876 Hypokalemia: Secondary | ICD-10-CM | POA: Diagnosis not present

## 2022-05-18 DIAGNOSIS — F12188 Cannabis abuse with other cannabis-induced disorder: Secondary | ICD-10-CM | POA: Diagnosis not present

## 2022-05-18 DIAGNOSIS — R1110 Vomiting, unspecified: Secondary | ICD-10-CM | POA: Diagnosis present

## 2022-05-18 LAB — COMPREHENSIVE METABOLIC PANEL
ALT: 84 U/L — ABNORMAL HIGH (ref 0–44)
AST: 53 U/L — ABNORMAL HIGH (ref 15–41)
Albumin: 4.2 g/dL (ref 3.5–5.0)
Alkaline Phosphatase: 85 U/L (ref 38–126)
Anion gap: 13 (ref 5–15)
BUN: 21 mg/dL — ABNORMAL HIGH (ref 6–20)
CO2: 20 mmol/L — ABNORMAL LOW (ref 22–32)
Calcium: 9.7 mg/dL (ref 8.9–10.3)
Chloride: 106 mmol/L (ref 98–111)
Creatinine, Ser: 1.21 mg/dL (ref 0.61–1.24)
GFR, Estimated: >60 mL/min (ref >60)
Glucose, Bld: 114 mg/dL — ABNORMAL HIGH (ref 70–99)
Potassium: 3.5 mmol/L (ref 3.5–5.1)
Sodium: 139 mmol/L (ref 135–145)
Total Bilirubin: 1.7 mg/dL — ABNORMAL HIGH (ref 0.3–1.2)
Total Protein: 7.7 g/dL (ref 6.5–8.1)

## 2022-05-18 LAB — CBC WITH DIFFERENTIAL/PLATELET
Abs Immature Granulocytes: 0.03 10*3/uL (ref 0.00–0.07)
Basophils Absolute: 0.1 10*3/uL (ref 0.0–0.1)
Basophils Relative: 1 %
Eosinophils Absolute: 0 10*3/uL (ref 0.0–0.5)
Eosinophils Relative: 0 %
HCT: 43.7 % (ref 39.0–52.0)
Hemoglobin: 14.7 g/dL (ref 13.0–17.0)
Immature Granulocytes: 0 %
Lymphocytes Relative: 14 %
Lymphs Abs: 1.6 10*3/uL (ref 0.7–4.0)
MCH: 28 pg (ref 26.0–34.0)
MCHC: 33.6 g/dL (ref 30.0–36.0)
MCV: 83.2 fL (ref 80.0–100.0)
Monocytes Absolute: 1.1 10*3/uL — ABNORMAL HIGH (ref 0.1–1.0)
Monocytes Relative: 10 %
Neutro Abs: 8.2 10*3/uL — ABNORMAL HIGH (ref 1.7–7.7)
Neutrophils Relative %: 75 %
Platelets: 240 10*3/uL (ref 150–400)
RBC: 5.25 MIL/uL (ref 4.22–5.81)
RDW: 12.7 % (ref 11.5–15.5)
WBC: 10.9 10*3/uL — ABNORMAL HIGH (ref 4.0–10.5)
nRBC: 0 % (ref 0.0–0.2)

## 2022-05-18 LAB — LIPASE, BLOOD: Lipase: 26 U/L (ref 11–51)

## 2022-05-18 LAB — MAGNESIUM: Magnesium: 1.7 mg/dL (ref 1.7–2.4)

## 2022-05-18 MED ORDER — DROPERIDOL 2.5 MG/ML IJ SOLN
2.5000 mg | Freq: Once | INTRAMUSCULAR | Status: AC
  Administered 2022-05-18: 2.5 mg via INTRAVENOUS
  Filled 2022-05-18: qty 2

## 2022-05-18 MED ORDER — LACTATED RINGERS IV BOLUS
2000.0000 mL | Freq: Once | INTRAVENOUS | Status: AC
  Administered 2022-05-18: 2000 mL via INTRAVENOUS

## 2022-05-18 NOTE — ED Provider Notes (Signed)
[facility] Provider Note    Event Date/Time   First MD Initiated Contact with Patient 05/18/22 [phone]     (approximate)   History   Emesis   HPI  Raymond Farmer is a 36 y.o. male who presents to the ED for evaluation of Emesis   I reviewed previous ED visit from about 1 year ago where patient was evaluated for the same, thought to be related to his cannabis use.  Patient presents to the ED, accompanied by his wife, for evaluation of about 3 days of recurrent emesis.  Reports symptoms started with recurrently throwing up on Wednesday and is continued for the past few days.  Had a couple episodes of diarrhea earlier today, but he minimizes this.  Denies any abdominal pain.  Does report developing some soreness to his bilateral flanks in the setting of recurrent retching.  Voiding without difficulty.  No dysuria, no hematuria, no fever or syncope.  Continues to smoke cannabis daily   Physical Exam   Triage Vital Signs: ED Triage Vitals  Enc Vitals Group     BP      Pulse      Resp      Temp      Temp src      SpO2      Weight      Height      Head Circumference      Peak Flow      Pain Score      Pain Loc      Pain Edu?      Excl. in GC?     Most recent vital signs: Vitals:   05/18/22 0530 05/18/22 0600  BP: 103/60 119/88  Pulse: (!) 49 92  Resp:    Temp:    SpO2: 96% 98%    General: Awake, no distress.  Hunched over in the wheelchair going back to his room. CV:  Good peripheral perfusion.  Resp:  Normal effort.  Abd:  No distention.  Soft and benign throughout MSK:  No deformity noted.  Neuro:  No focal deficits appreciated. Other:  Dry mucous membranes   ED Results / Procedures / Treatments   Labs (all labs ordered are listed, but only abnormal results are displayed) Labs Reviewed  CBC WITH DIFFERENTIAL/PLATELET - Abnormal; Notable for the following components:      Result Value   WBC 10.9 (*)    Neutro Abs 8.2 (*)     Monocytes Absolute 1.1 (*)    All other components within normal limits  COMPREHENSIVE METABOLIC PANEL - Abnormal; Notable for the following components:   CO2 20 (*)    Glucose, Bld 114 (*)    BUN 21 (*)    AST 53 (*)    ALT 84 (*)    Total Bilirubin 1.7 (*)    All other components within normal limits  LIPASE, BLOOD  MAGNESIUM  URINALYSIS, ROUTINE W REFLEX MICROSCOPIC    EKG   RADIOLOGY   Official radiology report(s): No results found.  PROCEDURES and INTERVENTIONS:  Procedures  Medications  lactated ringers bolus 2,000 mL (0 mLs Intravenous Stopped 05/18/22 0628)  droperidol (INAPSINE) 2.5 MG/ML injection 2.5 mg (2.5 mg Intravenous Given 05/18/22 0454)     IMPRESSION / MDM / ASSESSMENT AND PLAN / ED COURSE  I reviewed the triage vital signs and the nursing notes.  Differential diagnosis includes, but is not limited to, cannabis hyperemesis syndrome, pancreatitis, dehydration, acute cystitis, appendicitis, cholelithiasis  {  Patient presents with symptoms of an acute illness or injury that is potentially life-threatening.  36 year old male presents to the ED with recurrent emesis, likely due to cannabis hyperemesis syndrome suitable for outpatient management.  Reassuring vital signs and work-up overall.  Lipase is negative.  Marginal leukocytosis is noted, I doubt sepsis or infectious etiology of his symptoms.  Metabolic panel with slight not anion gap metabolic acidosis for which she received IV fluids.  Minimal elevations of AST, ALT and bilirubin are noted.  Alkaline phosphatase is normal.  I doubt hepatobiliary pathology considering his lack of pain.  Doubt cholelithiasis, choledocholithiasis or cholecystitis.  Symptoms resolved after IV fluids and droperidol.  Continues to have a benign abdomen, tolerating p.o. and suitable for outpatient management.  We discussed return precautions for the ED.  Clinical Course as of 05/18/22 0631  Fri May 18, 2022  0533 Reassessed.   Feeling better.  Requesting water. [DS]  0630 Tolerating p.o. and requesting discharge. 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   Delton Prairie, MD 05/18/22 [phone]

## 2022-05-18 NOTE — ED Notes (Signed)
Patient discharged at this time. Ambulated to lobby with independent and steady gait. Breathing unlabored speaking in full sentences. Verbalized understanding of all discharge, follow up, and medication teaching. Discharged homed with all belongings.   

## 2022-05-18 NOTE — ED Triage Notes (Addendum)
Pt presents via POV with complaints of emesis for the last 3-4 days with associated diarrhea that started  last night. Pt endorses frequent marijuana use. Denies CP or SOB.

## 2022-05-18 NOTE — ED Notes (Signed)
Pt called RN to room stating that he feels better and wants to be discharged. MD made aware and RN awaiting orders.

## 2022-05-20 ENCOUNTER — Emergency Department
Admission: EM | Admit: 2022-05-20 | Discharge: 2022-05-21 | Disposition: A | Discharge status: Home / Self Care | Payer: Medicaid Other | Attending: Emergency Medicine | Admitting: Emergency Medicine

## 2022-05-20 ENCOUNTER — Other Ambulatory Visit: Payer: Self-pay

## 2022-05-20 DIAGNOSIS — R1013 Epigastric pain: Secondary | ICD-10-CM | POA: Diagnosis not present

## 2022-05-20 DIAGNOSIS — F1290 Cannabis use, unspecified, uncomplicated: Secondary | ICD-10-CM | POA: Insufficient documentation

## 2022-05-20 DIAGNOSIS — E876 Hypokalemia: Secondary | ICD-10-CM | POA: Diagnosis not present

## 2022-05-20 DIAGNOSIS — F12188 Cannabis abuse with other cannabis-induced disorder: Secondary | ICD-10-CM | POA: Diagnosis not present

## 2022-05-20 DIAGNOSIS — R1084 Generalized abdominal pain: Secondary | ICD-10-CM | POA: Diagnosis not present

## 2022-05-20 DIAGNOSIS — R112 Nausea with vomiting, unspecified: Secondary | ICD-10-CM | POA: Diagnosis not present

## 2022-05-20 DIAGNOSIS — F419 Anxiety disorder, unspecified: Secondary | ICD-10-CM | POA: Diagnosis not present

## 2022-05-20 DIAGNOSIS — R001 Bradycardia, unspecified: Secondary | ICD-10-CM | POA: Diagnosis not present

## 2022-05-20 LAB — COMPREHENSIVE METABOLIC PANEL
ALT: 92 U/L — ABNORMAL HIGH (ref 0–44)
AST: 47 U/L — ABNORMAL HIGH (ref 15–41)
Albumin: 5 g/dL (ref 3.5–5.0)
Alkaline Phosphatase: 84 U/L (ref 38–126)
Anion gap: 11 (ref 5–15)
BUN: 12 mg/dL (ref 6–20)
CO2: 24 mmol/L (ref 22–32)
Calcium: 9.8 mg/dL (ref 8.9–10.3)
Chloride: 105 mmol/L (ref 98–111)
Creatinine, Ser: 1.22 mg/dL (ref 0.61–1.24)
GFR, Estimated: >60 mL/min (ref >60)
Glucose, Bld: 111 mg/dL — ABNORMAL HIGH (ref 70–99)
Potassium: 3.8 mmol/L (ref 3.5–5.1)
Sodium: 140 mmol/L (ref 135–145)
Total Bilirubin: 1.2 mg/dL (ref 0.3–1.2)
Total Protein: 8.4 g/dL — ABNORMAL HIGH (ref 6.5–8.1)

## 2022-05-20 LAB — URINE DRUG SCREEN, QUALITATIVE (ARMC ONLY)
Amphetamines, Ur Screen: NOT DETECTED
Barbiturates, Ur Screen: NOT DETECTED
Benzodiazepine, Ur Scrn: NOT DETECTED
Cannabinoid 50 Ng, Ur ~~LOC~~: POSITIVE — AB
Cocaine Metabolite,Ur ~~LOC~~: NOT DETECTED
MDMA (Ecstasy)Ur Screen: NOT DETECTED
Methadone Scn, Ur: NOT DETECTED
Opiate, Ur Screen: NOT DETECTED
Phencyclidine (PCP) Ur S: NOT DETECTED
Tricyclic, Ur Screen: NOT DETECTED

## 2022-05-20 LAB — LIPASE, BLOOD: Lipase: 30 U/L (ref 11–51)

## 2022-05-20 LAB — URINALYSIS, ROUTINE W REFLEX MICROSCOPIC
Bacteria, UA: NONE SEEN
Bilirubin Urine: NEGATIVE
Glucose, UA: NEGATIVE mg/dL
Hgb urine dipstick: NEGATIVE
Ketones, ur: 20 mg/dL — AB
Nitrite: NEGATIVE
Protein, ur: NEGATIVE mg/dL
Specific Gravity, Urine: 1.011 (ref 1.005–1.030)
Squamous Epithelial / HPF: NONE SEEN (ref 0–5)
pH: 7 (ref 5.0–8.0)

## 2022-05-20 LAB — CBC
HCT: 44.5 % (ref 39.0–52.0)
Hemoglobin: 14.9 g/dL (ref 13.0–17.0)
MCH: 28.4 pg (ref 26.0–34.0)
MCHC: 33.5 g/dL (ref 30.0–36.0)
MCV: 84.9 fL (ref 80.0–100.0)
Platelets: 226 10*3/uL (ref 150–400)
RBC: 5.24 MIL/uL (ref 4.22–5.81)
RDW: 12.6 % (ref 11.5–15.5)
WBC: 8.3 10*3/uL (ref 4.0–10.5)
nRBC: 0 % (ref 0.0–0.2)

## 2022-05-20 MED ORDER — HALOPERIDOL LACTATE 5 MG/ML IJ SOLN
5.0000 mg | Freq: Once | INTRAMUSCULAR | Status: AC
  Administered 2022-05-20: 5 mg via INTRAVENOUS
  Filled 2022-05-20: qty 1

## 2022-05-20 MED ORDER — DROPERIDOL 2.5 MG/ML IJ SOLN
2.5000 mg | Freq: Once | INTRAMUSCULAR | Status: AC
  Administered 2022-05-21: 2.5 mg via INTRAVENOUS
  Filled 2022-05-20: qty 2

## 2022-05-20 MED ORDER — FAMOTIDINE IN NACL 20-0.9 MG/50ML-% IV SOLN
20.0000 mg | Freq: Once | INTRAVENOUS | Status: AC
  Administered 2022-05-20: 20 mg via INTRAVENOUS
  Filled 2022-05-20: qty 50

## 2022-05-20 MED ORDER — SODIUM CHLORIDE 0.9 % IV BOLUS
1000.0000 mL | Freq: Once | INTRAVENOUS | Status: AC
  Administered 2022-05-20: 1000 mL via INTRAVENOUS

## 2022-05-20 NOTE — ED Triage Notes (Signed)
Pt presents to ER with c/o epigastric pain, tingling fingers, and dizziness since yesterday.  Pt reports "not feeling right" and states he has been feeling above mentioned symptoms since yesterday.  Pt noted to be tearful, and hyperventilating on arrival to triage room.  Pt states pain is throbbing in nature and is non-radiating.  Pt is A&O x4 at this time in NAD in triage.

## 2022-05-20 NOTE — ED Notes (Signed)
Pt noted to be anxious and pacing around the room at this time. Pt not willing to be hooked up to monitor at this time.  Pt denies etoh or other drugs. Pt c/o epigastric pain with dry heaving

## 2022-05-20 NOTE — ED Provider Notes (Signed)
[facility] Provider Note    Event Date/Time   First MD Initiated Contact with Patient 05/20/22 2305     (approximate)   History   Anxiety and Abdominal Pain   HPI  Raymond Farmer is a 36 y.o. male who returns to the ED from home with continued abdominal pain, dizziness, nausea/vomiting, tingling fingers and hyperventilating.  Patient was seen in the ED 2 days ago for cannabinoid hyperemesis syndrome.  He continues to smoke marijuana daily.  Denies fever, cough, chest pain, shortness of breath, dysuria or diarrhea.     Past Medical History   Past Medical History:  Diagnosis Date  . Hypokalemia    a. 11/2017 K 2.7 in ED.  Marland Kitchen Hypomagnesemia    a. 11/2017 Mg 1.4 in ED.  . Marijuana use   . Syncope   . Tobacco abuse      Active Problem List  There are no problems to display for this patient.    Past Surgical History  History reviewed. No pertinent surgical history.   Home Medications   Prior to Admission medications   Medication Sig Start Date End Date Taking? Authorizing Provider  ketorolac (TORADOL) 10 MG tablet Take 1 tablet (10 mg total) by mouth every 6 (six) hours as needed for moderate pain. 03/05/20   Sharman Cheek, MD  ondansetron (ZOFRAN ODT) 4 MG disintegrating tablet Take 1 tablet (4 mg total) by mouth every 8 (eight) hours as needed for nausea or vomiting. 02/28/21   Chesley Noon, MD     Allergies  Patient has no known allergies.   Family History   Family History  Problem Relation Age of Onset  . Other Mother        alive and well in her early 66's  . Diabetes Father        alive and well in his early 46's  . Hypertension Maternal Grandmother   . Other Sister        alive and well.     Physical Exam  Triage Vital Signs: ED Triage Vitals  Enc Vitals Group     BP 05/20/22 2046 134/81     Pulse Rate 05/20/22 2046 63     Resp 05/20/22 2046 20     Temp 05/20/22 2046 98.2 F (36.8 C)     Temp Source 05/20/22  2046 Oral     SpO2 05/20/22 2046 100 %     Weight 05/20/22 2047 110 lb (49.9 kg)     Height 05/20/22 2047 5\' 4"  (1.626 m)     Head Circumference --      Peak Flow --      Pain Score 05/20/22 2047 8     Pain Loc --      Pain Edu? --      Excl. in GC? --     Updated Vital Signs: BP (!) 110/57   Pulse (!) 55   Temp 98.1 F (36.7 C) (Oral)   Resp (!) 23   Ht 5\' 4"  (1.626 m)   Wt 49.9 kg   SpO2 100%   BMI 18.88 kg/m    General: Awake, mild distress.  CV:  RRR.  Good peripheral perfusion.  Resp:  Normal effort.  CTA B. Abd:  Mildly tender to epigastrium without rebound or guarding.  No truncal vesicles no distention.  Other:  Anxious.   ED Results / Procedures / Treatments  Labs (all labs ordered are listed, but only abnormal results are displayed)  Labs Reviewed  COMPREHENSIVE METABOLIC PANEL - Abnormal; Notable for the following components:      Result Value   Glucose, Bld 111 (*)    Total Protein 8.4 (*)    AST 47 (*)    ALT 92 (*)    All other components within normal limits  URINALYSIS, ROUTINE W REFLEX MICROSCOPIC - Abnormal; Notable for the following components:   Color, Urine YELLOW (*)    APPearance CLEAR (*)    Ketones, ur 20 (*)    Leukocytes,Ua TRACE (*)    All other components within normal limits  URINE DRUG SCREEN, QUALITATIVE ([facility] ONLY) - Abnormal; Notable for the following components:   Cannabinoid 50 Ng, Ur [place] POSITIVE (*)    All other components within normal limits  LIPASE, BLOOD  CBC     EKG  ED ECG REPORT I, Ceola Para J, the attending physician, personally viewed and interpreted this ECG.   Date: 05/21/2022  EKG Time: 0004  Rate: 45  Rhythm: sinus bradycardia  Axis: RAD  Intervals:none  ST&T Change: Nonspecific    RADIOLOGY None   Official radiology report(s): No results found.   PROCEDURES:  Critical Care performed: Yes, see critical care procedure note(s)  CRITICAL CARE Performed by: Irean Hong   Total critical  care time: 30 minutes  Critical care time was exclusive of separately billable procedures and treating other patients.  Critical care was necessary to treat or prevent imminent or life-threatening deterioration.  Critical care was time spent personally by me on the following activities: development of treatment plan with patient and/or surrogate as well as nursing, discussions with consultants, evaluation of patient's response to treatment, examination of patient, obtaining history from patient or surrogate, ordering and performing treatments and interventions, ordering and review of laboratory studies, ordering and review of radiographic studies, pulse oximetry and re-evaluation of patient's condition.   Marland Kitchen1-3 Lead EKG Interpretation  Performed by: Irean Hong, MD Authorized by: Irean Hong, MD     Interpretation: normal     ECG rate:  60   ECG rate assessment: normal     Rhythm: sinus rhythm     Ectopy: none     Conduction: normal   Comments:     Patient placed on cardiac monitor to evaluate for arrhythmias    MEDICATIONS ORDERED IN ED: Medications  sodium chloride 0.9 % bolus 1,000 mL (1,000 mLs Intravenous Bolus 05/20/22 2347)  haloperidol lactate (HALDOL) injection 5 mg (5 mg Intravenous Given 05/20/22 2346)  famotidine (PEPCID) IVPB 20 mg premix (0 mg Intravenous Stopped 05/21/22 0133)  droperidol (INAPSINE) 2.5 MG/ML injection 2.5 mg (2.5 mg Intravenous Given 05/21/22 0014)     IMPRESSION / MDM / ASSESSMENT AND PLAN / ED COURSE  I reviewed the triage vital signs and the nursing notes.                             36 year old male returning to the ED with continued abdominal pain, nausea and vomiting in the setting of daily marijuana use. Differential diagnosis includes, but is not limited to, cannabinol hyperemesis syndrome, biliary disease (biliary colic, acute cholecystitis, cholangitis, choledocholithiasis, etc), intrathoracic causes for epigastric abdominal pain including ACS,  gastritis, duodenitis, pancreatitis, small bowel or large bowel obstruction, abdominal aortic aneurysm, hernia, and ulcer(s).  I have personally reviewed patient's records and note his ED visit on 05/18/2022.  Patient's presentation is most consistent with acute complicated illness / injury requiring  diagnostic workup.  The patient is on the cardiac monitor to evaluate for evidence of arrhythmia and/or significant heart rate changes.  Laboratory results demonstrate normal WBC 8.3, minimally elevated transaminases AST 47, ALT 92 with normal T. bili and lipase.  Urine demonstrates ketonuria, UDS demonstrates cannabinoids.  Will initiate IV fluid resuscitation, IV Haldol for refractory nausea/vomiting, IV Pepcid and reassess.  Clinical Course as of 05/21/22 0159  Mon May 21, 2022  0018 Patient became very agitated after administration of IV Haldol.  EKG was obtained which demonstrates normal QTc.  IV Droperidol administered as this seemed to help at his last visit [JS]  0158 Patient feeling significantly better.  Tolerated ice chips without emesis.  Encouraged patient to stop using marijuana.  Will discharge home with antiemetic, anxiolytic per patient's request and capsaicin cream.  He will follow-up closely with his PCP.  Strict return precautions given.  Patient and significant other verbalized understanding and agree with plan of care. [JS]    Clinical Course User Index [JS] Irean Hong, MD     FINAL CLINICAL IMPRESSION(S) / ED DIAGNOSES   Final diagnoses:  Cannabinoid hyperemesis syndrome  Anxiety  Generalized abdominal pain  Nausea and vomiting, unspecified vomiting type     Rx / DC Orders   ED Discharge Orders     None        Note:  This document was prepared using Dragon voice recognition software and may include unintentional dictation errors.   Irean Hong, MD 05/21/22 [phone]

## 2022-05-21 MED ORDER — LORAZEPAM 1 MG PO TABS: 1.0000 mg | ORAL_TABLET | Freq: Three times a day (TID) | ORAL | 0 refills | Status: AC | PRN

## 2022-05-21 MED ORDER — CAPSAICIN 0.075 % EX CREA: 1.0000 | TOPICAL_CREAM | Freq: Two times a day (BID) | CUTANEOUS | 0 refills | Status: AC

## 2022-05-21 MED ORDER — ONDANSETRON 4 MG PO TBDP: 4.0000 mg | ORAL_TABLET | Freq: Three times a day (TID) | ORAL | 0 refills | Status: AC | PRN

## 2022-05-21 NOTE — Discharge Instructions (Signed)
You may take Zofran as needed for nausea/vomiting. You may take Ativan as needed for anxiety. Apply Zostrix cream twice daily. Use gloves while handling and apply saran wrap over cream. You must stop using marijuana if you want to stop feeling bad and vomiting. Return to the ER for worsening symptoms, persistent vomiting, difficulty breathing or other concerns.

## 2022-06-13 ENCOUNTER — Emergency Department: Payer: Medicaid Other

## 2022-06-13 ENCOUNTER — Other Ambulatory Visit: Payer: Self-pay

## 2022-06-13 ENCOUNTER — Emergency Department
Admission: EM | Admit: 2022-06-13 | Discharge: 2022-06-13 | Disposition: A | Discharge status: Home / Self Care | Payer: Medicaid Other | Attending: Emergency Medicine | Admitting: Emergency Medicine

## 2022-06-13 DIAGNOSIS — F419 Anxiety disorder, unspecified: Secondary | ICD-10-CM | POA: Insufficient documentation

## 2022-06-13 DIAGNOSIS — R079 Chest pain, unspecified: Secondary | ICD-10-CM

## 2022-06-13 DIAGNOSIS — R0789 Other chest pain: Secondary | ICD-10-CM | POA: Insufficient documentation

## 2022-06-13 DIAGNOSIS — R001 Bradycardia, unspecified: Secondary | ICD-10-CM | POA: Diagnosis not present

## 2022-06-13 LAB — HEPATIC FUNCTION PANEL
ALT: 13 U/L (ref 0–44)
AST: 18 U/L (ref 15–41)
Albumin: 4.5 g/dL (ref 3.5–5.0)
Alkaline Phosphatase: 86 U/L (ref 38–126)
Bilirubin, Direct: <0.1 mg/dL (ref 0.0–0.2)
Total Bilirubin: 0.9 mg/dL (ref 0.3–1.2)
Total Protein: 7.3 g/dL (ref 6.5–8.1)

## 2022-06-13 LAB — BASIC METABOLIC PANEL
Anion gap: 5 (ref 5–15)
BUN: 12 mg/dL (ref 6–20)
CO2: 27 mmol/L (ref 22–32)
Calcium: 9.1 mg/dL (ref 8.9–10.3)
Chloride: 107 mmol/L (ref 98–111)
Creatinine, Ser: 0.93 mg/dL (ref 0.61–1.24)
GFR, Estimated: >60 mL/min (ref >60)
Glucose, Bld: 96 mg/dL (ref 70–99)
Potassium: 4.3 mmol/L (ref 3.5–5.1)
Sodium: 139 mmol/L (ref 135–145)

## 2022-06-13 LAB — CBC
HCT: 42.3 % (ref 39.0–52.0)
Hemoglobin: 13.7 g/dL (ref 13.0–17.0)
MCH: 27.9 pg (ref 26.0–34.0)
MCHC: 32.4 g/dL (ref 30.0–36.0)
MCV: 86.2 fL (ref 80.0–100.0)
Platelets: 226 10*3/uL (ref 150–400)
RBC: 4.91 MIL/uL (ref 4.22–5.81)
RDW: 13.2 % (ref 11.5–15.5)
WBC: 10.9 10*3/uL — ABNORMAL HIGH (ref 4.0–10.5)
nRBC: 0 % (ref 0.0–0.2)

## 2022-06-13 LAB — TROPONIN I (HIGH SENSITIVITY)
Troponin I (High Sensitivity): <2 ng/L (ref <18)
Troponin I (High Sensitivity): <2 ng/L (ref <18)

## 2022-06-13 LAB — LIPASE, BLOOD: Lipase: 35 U/L (ref 11–51)

## 2022-06-13 NOTE — ED Triage Notes (Signed)
Pt states he has left sided chest pain that is worse with deep breaths and when pressed on. Pt states it started around 3 pm when he was loading a car on a roll back.

## 2022-06-13 NOTE — ED Notes (Signed)
Pt gives verbal consent to DC 

## 2022-06-13 NOTE — ED Provider Triage Note (Signed)
Emergency Medicine Provider Triage Evaluation Note  Raymond Farmer , a 36 y.o. male  was evaluated in triage.  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 Pain is worse with movement. No cardiac history. Vapes but no cigarette smoking.  Physical Exam  Ht 5\' 4"  (1.626 m)   Wt 52.2 kg   BMI 19.74 kg/m  Gen:   Awake, no distress   Resp:  Normal effort  MSK:   Moves extremities without difficulty  Other:    Medical Decision Making  Medically screening exam initiated at 6:00 PM.  Appropriate orders placed.  Raymond Farmer was informed that the remainder of the evaluation will be completed by another provider, this initial triage assessment does not replace that evaluation, and the importance of remaining in the ED until their evaluation is complete.     Florinda Marker, FNP 06/13/22 [phone]

## 2022-06-13 NOTE — ED Provider Notes (Signed)
[facility] Provider Note  Patient Contact: 9:13 PM (approximate)   History   Chest Pain   HPI  Raymond Farmer is a 36 y.o. male with a history of syncope, presents to the emergency department with left-sided chest pain that started after patient was loading a car on a trailer.  Patient states that it hurts when he takes deep breaths.  Patient states that he could reproduce the pain initially with palpation but can no longer.  Patient states that he feels very anxious being in the emergency department as he does not like hospitals.  He denies associated shortness of breath.  No nausea, vomiting or abdominal pain.      Physical Exam   Triage Vital Signs: ED Triage Vitals  Enc Vitals Group     BP 06/13/22 1804 127/85     Pulse Rate 06/13/22 1804 (!) 56     Resp 06/13/22 1804 18     Temp 06/13/22 1804 98.5 F (36.9 C)     Temp Source 06/13/22 1804 Oral     SpO2 06/13/22 1804 100 %     Weight 06/13/22 1748 115 lb (52.2 kg)     Height 06/13/22 1748 5\' 4"  (1.626 m)     Head Circumference --      Peak Flow --      Pain Score 06/13/22 1747 7     Pain Loc --      Pain Edu? --      Excl. in GC? --     Most recent vital signs: Vitals:   06/13/22 1804 06/13/22 2141  BP: 127/85 132/80  Pulse: (!) 56 64  Resp: 18 17  Temp: 98.5 F (36.9 C) 98.2 F (36.8 C)  SpO2: 100% 98%     General: Alert and in no acute distress. Eyes:  PERRL. EOMI. Head: No acute traumatic findings ENT:      Nose: No congestion/rhinnorhea.      Mouth/Throat: Mucous membranes are moist. Neck: No stridor. No cervical spine tenderness to palpation. Cardiovascular:  Good peripheral perfusion.  No anterior chest wall tenderness to palpation. Respiratory: Normal respiratory effort without tachypnea or retractions. Lungs CTAB. Good air entry to the bases with no decreased or absent breath sounds. Gastrointestinal: Bowel sounds 4 quadrants. Soft and nontender to palpation. No  guarding or rigidity. No palpable masses. No distention. No CVA tenderness. Musculoskeletal: Full range of motion to all extremities.  Neurologic:  No gross focal neurologic deficits are appreciated.  Skin:   No rash noted    ED Results / Procedures / Treatments   Labs (all labs ordered are listed, but only abnormal results are displayed) Labs Reviewed  CBC - Abnormal; Notable for the following components:      Result Value   WBC 10.9 (*)    All other components within normal limits  BASIC METABOLIC PANEL  LIPASE, BLOOD  HEPATIC FUNCTION PANEL  TROPONIN I (HIGH SENSITIVITY)  TROPONIN I (HIGH SENSITIVITY)     EKG  Sinus bradycardia with no ST segment elevation or other apparent arrhythmia.   RADIOLOGY  I personally viewed and evaluated these images as part of my medical decision making, as well as reviewing the written report by the radiologist.  ED Provider Interpretation: No consolidations, opacities or infiltrates to suggest pneumonia.  No pneumothorax or pneumomediastinum.   PROCEDURES:  Critical Care performed: No  Procedures   MEDICATIONS ORDERED IN ED: Medications - No data to display   IMPRESSION /  MDM / ASSESSMENT AND PLAN / ED COURSE  I reviewed the triage vital signs and the nursing notes.                              Assessment and plan Chest pain 36 year old male presents to the emergency department with left-sided chest pain that started after patient loaded a car on a trailer.  Patient was bradycardic at triage.  On exam, patient seemed anxious.  He had no reproducible tenderness to palpation.  CBC, BMP and delta troponin within range no consolidations, opacities or infiltrates to suggest pneumonia.  EKG indicated sinus bradycardia without ST segment elevation or other apparent arrhythmia.  Return precautions were given to return with new or worsening symptoms.     FINAL CLINICAL IMPRESSION(S) / ED DIAGNOSES   Final diagnoses:  Chest  pain, unspecified type     Rx / DC Orders   ED Discharge Orders     None        Note:  This document was prepared using Dragon voice recognition software and may include unintentional dictation errors.   Pia Mau Henlawson, PA-C 06/13/22 2222    Arnaldo Natal, MD 06/13/22 [phone]

## 2023-03-06 ENCOUNTER — Ambulatory Visit (INDEPENDENT_AMBULATORY_CARE_PROVIDER_SITE_OTHER): Payer: Medicaid Other

## 2023-03-06 ENCOUNTER — Ambulatory Visit
Admission: EM | Admit: 2023-03-06 | Discharge: 2023-03-06 | Disposition: A | Discharge status: Home / Self Care | Payer: Medicaid Other | Attending: Emergency Medicine | Admitting: Emergency Medicine

## 2023-03-06 DIAGNOSIS — M25571 Pain in right ankle and joints of right foot: Secondary | ICD-10-CM

## 2023-03-06 DIAGNOSIS — M79671 Pain in right foot: Secondary | ICD-10-CM

## 2023-03-06 NOTE — ED Triage Notes (Signed)
Pt c/o starting right Foot pain starting Friday night eased up and worsened. No recent known injury. 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

## 2023-03-06 NOTE — Discharge Instructions (Signed)
Schedule an appointment with a podiatrist such as the one listed below.

## 2023-03-06 NOTE — ED Provider Notes (Signed)
Raymond Farmer    CSN: [id number] Arrival date & time: 03/06/23  1137      History   Chief Complaint Chief Complaint  Patient presents with   Foot Pain    HPI Raymond Farmer is a 37 y.o. male.  Patient presents with right foot and ankle pain x 5 days.  No falls or injury.  No numbness, weakness, wounds, redness, bruising, or other symptoms.  No OTC medications taken today.  Patient reports history of club foot surgery as a child.  He was seen by podiatrist in the past but not in several years.    The history is provided by the patient and medical records.    Past Medical History:  Diagnosis Date   Hypokalemia    a. 11/2017 K 2.7 in ED.   Hypomagnesemia    a. 11/2017 Mg 1.4 in ED.   Marijuana use    Syncope    Tobacco abuse     There are no problems to display for this patient.   History reviewed. No pertinent surgical history.     Home Medications    Prior to Admission medications   Medication Sig Start Date End Date Taking? Authorizing Provider  capsicum (ZOSTRIX) 0.075 % topical cream Apply 1 Application topically 2 (two) times daily. 05/21/22   Irean Hong, MD  ketorolac (TORADOL) 10 MG tablet Take 1 tablet (10 mg total) by mouth every 6 (six) hours as needed for moderate pain. 03/05/20   Sharman Cheek, MD  LORazepam (ATIVAN) 1 MG tablet Take 1 tablet (1 mg total) by mouth every 8 (eight) hours as needed for anxiety. 05/21/22   Irean Hong, MD  ondansetron (ZOFRAN-ODT) 4 MG disintegrating tablet Take 1 tablet (4 mg total) by mouth every 8 (eight) hours as needed for nausea or vomiting. 05/21/22   Irean Hong, MD    Family History Family History  Problem Relation Age of Onset   Other Mother        alive and well in her early 17's   Diabetes Father        alive and well in his early 39's   Hypertension Maternal Grandmother    Other Sister        alive and well.    Social History Social History   Tobacco Use   Smoking status: Former    Packs/day:  1.00    Years: 15.00    Additional pack years: 0.00    Total pack years: 15.00    Types: E-cigarettes, Cigarettes   Smokeless tobacco: Never  Vaping Use   Vaping Use: Every day   Substances: Nicotine  Substance Use Topics   Alcohol use: No   Drug use: Not Currently     Allergies   Patient has no known allergies.   Review of Systems Review of Systems  Constitutional:  Negative for chills and fever.  Musculoskeletal:  Positive for arthralgias. Negative for gait problem and joint swelling.  Skin:  Negative for color change, rash and wound.  Neurological:  Negative for weakness and numbness.  All other systems reviewed and are negative.    Physical Exam Triage Vital Signs ED Triage Vitals  Enc Vitals Group     BP      Pulse      Resp      Temp      Temp src      SpO2      Weight      Height  Head Circumference      Peak Flow      Pain Score      Pain Loc      Pain Edu?      Excl. in GC?    No data found.  Updated Vital Signs BP 110/66 (BP Location: Right Arm)   Pulse 60   Temp 97.6 F (36.4 C)   Resp 18   SpO2 97%   Visual Acuity Right Eye Distance:   Left Eye Distance:   Bilateral Distance:    Right Eye Near:   Left Eye Near:    Bilateral Near:     Physical Exam Vitals and nursing note reviewed.  Constitutional:      General: He is not in acute distress.    Appearance: Normal appearance. He is well-developed. He is not ill-appearing.  HENT:     Mouth/Throat:     Mouth: Mucous membranes are moist.  Cardiovascular:     Rate and Rhythm: Normal rate and regular rhythm.  Pulmonary:     Effort: Pulmonary effort is normal. No respiratory distress.  Musculoskeletal:        General: Tenderness present. No swelling, deformity or signs of injury. Normal range of motion.     Cervical back: Neck supple.       Feet:  Skin:    General: Skin is warm and dry.     Capillary Refill: Capillary refill takes less than 2 seconds.     Findings: No  bruising, erythema, lesion or rash.  Neurological:     General: No focal deficit present.     Mental Status: He is alert and oriented to person, place, and time.     Sensory: No sensory deficit.     Motor: No weakness.     Gait: Gait normal.  Psychiatric:        Mood and Affect: Mood normal.        Behavior: Behavior normal.      UC Treatments / Results  Labs (all labs ordered are listed, but only abnormal results are displayed) Labs Reviewed - No data to display  EKG   Radiology DG Ankle Complete Right  Result Date: 03/06/2023 CLINICAL DATA:  pain x 5 days. no injury. hx of chronic pain in right foot. hx of club foot surgery as a child EXAM: RIGHT ANKLE - COMPLETE 3+ VIEW COMPARISON:  Right foot radiograph 09/08/2017 FINDINGS: Unchanged pes cavus deformity with dorsal widening of the talonavicular and navicular-cuneiform joints. 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 There is no evidence of acute fracture. No dislocation. IMPRESSION: No evidence of acute fracture or dislocation. Stable chronic changes related to prior clubfoot surgery. Mild hindfoot and midfoot arthritis. Electronically Signed   By: Caprice Renshaw M.D.   On: 03/06/2023 12:22    Procedures Procedures (including critical care time)  Medications Ordered in UC Medications - No data to display  Initial Impression / Assessment and Plan / UC Course  I have reviewed the triage vital signs and the nursing notes.  Pertinent labs & imaging results that were available during my care of the patient were reviewed by me and considered in my medical decision making (see chart for details).    Right foot and ankle pain.  X-ray negative for acute abnormality.  Discussed symptomatic treatment including Tylenol or ibuprofen, rest, elevation, ice packs.  Instructed patient to follow-up with a podiatrist.  Contact information for on-call podiatrist provided.  Education  provided  on foot and ankle pain.  He agrees to plan of care.  Final Clinical Impressions(s) / UC Diagnoses   Final diagnoses:  Right foot pain  Acute right ankle pain     Discharge Instructions      Schedule an appointment with a podiatrist such as the one listed below.       ED Prescriptions   None    I have reviewed the PDMP during this encounter.   Mickie Bail, NP 03/06/23 1241
